# Patient Record
Sex: Male | Born: 1970 | Race: White | Hispanic: No | Marital: Married | Smoking: Never smoker
Health system: Southern US, Community
[De-identification: ages and names within clinical notes are randomized; demographics above are authoritative.]

## PROBLEM LIST (undated history)

## (undated) DIAGNOSIS — K529 Noninfective gastroenteritis and colitis, unspecified: Secondary | ICD-10-CM

## (undated) DIAGNOSIS — D649 Anemia, unspecified: Secondary | ICD-10-CM

## (undated) DIAGNOSIS — K469 Unspecified abdominal hernia without obstruction or gangrene: Secondary | ICD-10-CM

## (undated) DIAGNOSIS — L570 Actinic keratosis: Secondary | ICD-10-CM

## (undated) DIAGNOSIS — L409 Psoriasis, unspecified: Secondary | ICD-10-CM

## (undated) DIAGNOSIS — K5090 Crohn's disease, unspecified, without complications: Secondary | ICD-10-CM

## (undated) HISTORY — PX: HERNIA REPAIR: SHX51

## (undated) HISTORY — DX: Actinic keratosis: L57.0

## (undated) HISTORY — PX: COLONOSCOPY: SHX174

## (undated) HISTORY — PX: APPENDECTOMY: SHX54

---

## 2005-12-07 ENCOUNTER — Ambulatory Visit: Payer: Self-pay | Admitting: Gastroenterology

## 2006-02-03 ENCOUNTER — Ambulatory Visit: Payer: Self-pay | Admitting: Surgery

## 2007-02-09 ENCOUNTER — Ambulatory Visit: Payer: Self-pay | Admitting: Internal Medicine

## 2007-11-09 ENCOUNTER — Ambulatory Visit: Payer: Self-pay | Admitting: Gastroenterology

## 2008-09-19 ENCOUNTER — Emergency Department: Payer: Self-pay | Admitting: Internal Medicine

## 2012-09-25 DIAGNOSIS — C4492 Squamous cell carcinoma of skin, unspecified: Secondary | ICD-10-CM

## 2012-09-25 HISTORY — DX: Squamous cell carcinoma of skin, unspecified: C44.92

## 2013-11-30 ENCOUNTER — Ambulatory Visit: Payer: Self-pay | Admitting: Gastroenterology

## 2013-12-05 LAB — PATHOLOGY REPORT

## 2015-01-09 ENCOUNTER — Other Ambulatory Visit: Payer: Self-pay | Admitting: Internal Medicine

## 2015-01-09 DIAGNOSIS — M25562 Pain in left knee: Secondary | ICD-10-CM

## 2015-01-16 ENCOUNTER — Ambulatory Visit: Admission: RE | Admit: 2015-01-16 | Payer: BLUE CROSS/BLUE SHIELD | Source: Ambulatory Visit

## 2015-01-16 ENCOUNTER — Ambulatory Visit: Payer: BLUE CROSS/BLUE SHIELD

## 2015-01-27 ENCOUNTER — Ambulatory Visit
Admission: RE | Admit: 2015-01-27 | Discharge: 2015-01-27 | Disposition: A | Discharge status: Home / Self Care | Payer: BLUE CROSS/BLUE SHIELD | Source: Ambulatory Visit | Attending: Internal Medicine | Admitting: Internal Medicine

## 2015-01-27 DIAGNOSIS — M25562 Pain in left knee: Secondary | ICD-10-CM

## 2015-01-27 MED ORDER — IOHEXOL 240 MG/ML SOLN: 20.0000 mL | Freq: Once | INTRAMUSCULAR | Status: DC | PRN

## 2015-06-26 ENCOUNTER — Encounter: Payer: Self-pay | Admitting: *Deleted

## 2015-06-27 ENCOUNTER — Ambulatory Visit: Payer: Managed Care, Other (non HMO) | Admitting: Anesthesiology

## 2015-06-27 ENCOUNTER — Encounter
Admission: RE | Disposition: A | Discharge status: Home / Self Care | Payer: Self-pay | Source: Ambulatory Visit | Attending: Gastroenterology

## 2015-06-27 ENCOUNTER — Encounter: Payer: Self-pay | Admitting: *Deleted

## 2015-06-27 ENCOUNTER — Ambulatory Visit
Admission: RE | Admit: 2015-06-27 | Discharge: 2015-06-27 | Disposition: A | Discharge status: Home / Self Care | Payer: Managed Care, Other (non HMO) | Source: Ambulatory Visit | Attending: Gastroenterology | Admitting: Gastroenterology

## 2015-06-27 DIAGNOSIS — K529 Noninfective gastroenteritis and colitis, unspecified: Secondary | ICD-10-CM | POA: Insufficient documentation

## 2015-06-27 DIAGNOSIS — K5090 Crohn's disease, unspecified, without complications: Secondary | ICD-10-CM | POA: Diagnosis present

## 2015-06-27 DIAGNOSIS — K644 Residual hemorrhoidal skin tags: Secondary | ICD-10-CM | POA: Insufficient documentation

## 2015-06-27 DIAGNOSIS — Z79899 Other long term (current) drug therapy: Secondary | ICD-10-CM | POA: Insufficient documentation

## 2015-06-27 HISTORY — DX: Anemia, unspecified: D64.9

## 2015-06-27 HISTORY — DX: Psoriasis, unspecified: L40.9

## 2015-06-27 HISTORY — PX: COLONOSCOPY: SHX5424

## 2015-06-27 HISTORY — DX: Noninfective gastroenteritis and colitis, unspecified: K52.9

## 2015-06-27 HISTORY — DX: Unspecified abdominal hernia without obstruction or gangrene: K46.9

## 2015-06-27 HISTORY — DX: Crohn's disease, unspecified, without complications: K50.90

## 2015-06-27 SURGERY — COLONOSCOPY
Anesthesia: General

## 2015-06-27 MED ORDER — PROPOFOL 10 MG/ML IV BOLUS
INTRAVENOUS | Status: DC | PRN
  Administered 2015-06-27: 20 mg via INTRAVENOUS
  Administered 2015-06-27: 30 mg via INTRAVENOUS
  Administered 2015-06-27: 40 mg via INTRAVENOUS
  Administered 2015-06-27: 50 mg via INTRAVENOUS

## 2015-06-27 MED ORDER — PROPOFOL 500 MG/50ML IV EMUL
INTRAVENOUS | Status: DC | PRN
  Administered 2015-06-27: 100 ug/kg/min via INTRAVENOUS

## 2015-06-27 MED ORDER — FENTANYL CITRATE (PF) 100 MCG/2ML IJ SOLN
INTRAMUSCULAR | Status: DC | PRN
  Administered 2015-06-27: 50 ug via INTRAVENOUS

## 2015-06-27 MED ORDER — SODIUM CHLORIDE 0.9 % IV SOLN
INTRAVENOUS | Status: DC
  Administered 2015-06-27: 10:00:00 via INTRAVENOUS

## 2015-06-27 MED ORDER — SODIUM CHLORIDE 0.9 % IV SOLN: INTRAVENOUS | Status: DC

## 2015-06-27 NOTE — Anesthesia Preprocedure Evaluation (Signed)
Anesthesia Evaluation  Patient identified by MRN, date of birth, ID band Patient awake    Reviewed: Allergy & Precautions, H&P , NPO status , Patient's Chart, lab work & pertinent test results, reviewed documented beta blocker date and time   Airway Mallampati: II   Neck ROM: full    Dental  (+) Teeth Intact   Pulmonary neg pulmonary ROS,    Pulmonary exam normal        Cardiovascular negative cardio ROS Normal cardiovascular exam     Neuro/Psych negative neurological ROS  negative psych ROS   GI/Hepatic negative GI ROS, Neg liver ROS,   Endo/Other  negative endocrine ROS  Renal/GU negative Renal ROS  negative genitourinary   Musculoskeletal   Abdominal   Peds  Hematology negative hematology ROS (+) anemia ,   Anesthesia Other Findings Past Medical History:   Anemia                                                       Colitis                                                      Crohn's disease ([place])                                        Psoriasis                                                    Abdominal hernia                                           Past Surgical History:   HERNIA REPAIR                                                 APPENDECTOMY                                                  COLONOSCOPY                                                   Reproductive/Obstetrics                             Anesthesia Physical Anesthesia Plan  ASA: II  Anesthesia Plan: General   Post-op Pain Management:    Induction:  Airway Management Planned:   Additional Equipment:   Intra-op Plan:   Post-operative Plan:   Informed Consent: I have reviewed the patients History and Physical, chart, labs and discussed the procedure including the risks, benefits and alternatives for the proposed anesthesia with the patient or authorized representative who has indicated his/her  understanding and acceptance.   Dental Advisory Given  Plan Discussed with: CRNA  Anesthesia Plan Comments:         Anesthesia Quick Evaluation

## 2015-06-27 NOTE — H&P (Signed)
Outpatient short stay form Pre-procedure 06/27/2015 9:39 AM Calvin Sails MD  Primary Physician: Dr Tracie Harrier  Reason for visit:  Colonoscopy  History of present illness:  Patient is a 45 year old male presenting today for colonoscopy. He has a history of disease diagnosed in 2004. He is been on nasal call as well as Imuran for maintenance medication. He had a flare about 3 months ago which was well-controlled with a prednisone taper. He is presenting today for further evaluation prior to consideration of increasing Imuran dosing. He tolerated his prep well. He takes no blood thinning medications or aspirin products.    Current facility-administered medications:  .  0.9 %  sodium chloride infusion, , Intravenous, Continuous, Calvin Sails, MD, Last Rate: 20 mL/hr at 06/27/15 0936 .  0.9 %  sodium chloride infusion, , Intravenous, Continuous, Calvin Sails, MD  Prescriptions prior to admission  Medication Sig Dispense Refill Last Dose  . mesalamine (ASACOL) 400 MG EC tablet Take 400 mg by mouth 3 (three) times daily.     . Multiple Vitamins-Minerals (MULTIVITAMIN WITH MINERALS) tablet Take 1 tablet by mouth daily.   Past Week at Unknown time     No Known Allergies   Past Medical History  Diagnosis Date  . Anemia   . Colitis   . Crohn's disease ([place])   . Psoriasis   . Abdominal hernia     Review of systems:      Physical Exam    Heart and lungs: Regular rate and rhythm without rub or gallop, lungs are bilaterally clear.    HEENT: Normocephalic atraumatic eyes are anicteric    Other:     Pertinant exam for procedure: Soft nontender nondistended bowel sounds positive normoactive.    Planned proceedures: Colonoscopy and indicated procedures. I have discussed the risks benefits and complications of procedures to include not limited to bleeding, infection, perforation and the risk of sedation and the patient wishes to proceed.    Calvin Sails,  MD Gastroenterology 06/27/2015  9:39 AM

## 2015-06-27 NOTE — Anesthesia Postprocedure Evaluation (Signed)
Anesthesia Post Note  Patient: Calvin Peters  Procedure(s) Performed: Procedure(s) (LRB): COLONOSCOPY (N/A)  Patient location during evaluation: PACU Anesthesia Type: General Level of consciousness: awake and alert Pain management: pain level controlled Vital Signs Assessment: post-procedure vital signs reviewed and stable Respiratory status: spontaneous breathing, nonlabored ventilation, respiratory function stable and patient connected to nasal cannula oxygen Cardiovascular status: blood pressure returned to baseline and stable Postop Assessment: no signs of nausea or vomiting Anesthetic complications: no    Last Vitals:  Filed Vitals:   06/27/15 1040 06/27/15 1050  BP: 118/84 127/89  Pulse: 60 57  Temp:    Resp: 18 12    Last Pain: There were no vitals filed for this visit.               Molli Barrows

## 2015-06-27 NOTE — Op Note (Addendum)
[facility] Gastroenterology Patient Name: Calvin Peters Procedure Date: 06/27/2015 9:40 AM MRN: [id number] Account #: [id number] Date of Birth: 1970-11-24 Admit Type: Outpatient Age: 45 Room: [facility] ENDO ROOM 3 Gender: Male Note Status: Supervisor Override Procedure:         Colonoscopy Indications:       Personal history of Crohn's disease Providers:         Lollie Sails, MD Referring MD:      Tracie Harrier, MD (Referring MD) Medicines:         Monitored Anesthesia Care Complications:     No immediate complications. Procedure:         Pre-Anesthesia Assessment:                    - ASA Grade Assessment: II - A patient with mild systemic                     disease.                    After obtaining informed consent, the colonoscope was                     passed under direct vision. Throughout the procedure, the                     patient's blood pressure, pulse, and oxygen saturations                     were monitored continuously. The Olympus CF-H180AL                     colonoscope ( S#: [id number] ) was introduced through the                     anus and advanced to the the terminal ileum. The quality                     of the bowel preparation was good. The patient tolerated                     the procedure well. Findings:      Inflammation characterized by congestion (edema), erythema, friability       and granularity was found in a continuous and circumferential pattern       from the rectum to the sigmoid colon. This was mild in severity. There       are innumerable pseudopolyps beginning in the mid sigmoid and extending       proximally to the proximal ascending colon. Multiple of these were       biopsied. Biopsies for histology were taken with a cold forceps from the       cecum, ascending colon, transverse colon, descending colon, sigmoid       colon and rectum for evaluation of microscopic colitis.      The terminal ileum appeared  normal. Biopsies were taken with a cold       forceps for histology.      Non-bleeding external and internal hemorrhoids were found during       perianal exam and during anoscopy. The hemorrhoids were Grade I       (internal hemorrhoids that do not prolapse). Impression:        - Inflammation was found from the rectum to the sigmoid  colon secondary to Crohn's disease. Biopsied.                    - The examined portion of the ileum was normal. Biopsied. Recommendation:    - Discharge patient to home.                    - Discharge patient to home.                    - Continue present medications. Procedure Code(s): --- Professional ---                    [phone], Colonoscopy, flexible; with biopsy, single or                     multiple Diagnosis Code(s): --- Professional ---                    K50.90, Crohn's disease, unspecified, without complications                    Z87.19, Personal history of other diseases of the                     digestive system CPT copyright 2014 American [employer] Association. All rights reserved. The codes documented in this report are preliminary and upon coder review may  be revised to meet current compliance requirements. Lollie Sails, MD 06/27/2015 10:24:12 AM This report has been signed electronically. Number of Addenda: 0 Note Initiated On: 06/27/2015 9:40 AM Scope Withdrawal Time: 0 hours 19 minutes 11 seconds  Total Procedure Duration: 0 hours 28 minutes 24 seconds       [facility]

## 2015-06-27 NOTE — Transfer of Care (Signed)
Immediate Anesthesia Transfer of Care Note  Patient: Calvin Peters  Procedure(s) Performed: Procedure(s): COLONOSCOPY (N/A)  Patient Location: PACU  Anesthesia Type:General  Level of Consciousness: awake, alert , oriented and patient cooperative  Airway & Oxygen Therapy: Patient Spontanous Breathing and Patient connected to nasal cannula oxygen  Post-op Assessment: Report given to RN, Post -op Vital signs reviewed and stable and Patient moving all extremities  Post vital signs: Reviewed and stable  Last Vitals:  Filed Vitals:   06/27/15 0923 06/27/15 1021  BP: 122/83 107/69  Pulse: 66 69  Temp: 36.1 C 36 C  Resp: 18 19    Complications: No apparent anesthesia complications

## 2015-06-28 ENCOUNTER — Encounter: Payer: Self-pay | Admitting: Gastroenterology

## 2015-06-30 LAB — SURGICAL PATHOLOGY

## 2016-06-15 IMAGING — MR MR KNEE*L* W/O CM
6 series · 40 of 40 positions shown · IV contrast (agent unspecified)
Comparison: None.

CLINICAL DATA: Persistent knee pain with running.

EXAM:
MRI OF THE LEFT KNEE WITH CONTRAST(MR Arthrogram)
TECHNIQUE: Multiplanar, multisequence MR imaging of the knee was performed
following intra-articular contrast injection under fluoroscopic
guidance. No intravenous contrast was administered.

[Series 3: T1 fat-sat · axial · 3.0mm · 0.62mm/px · z∈[-57,+55]mm · 8 of 35 slices shown (1 of 3)]
[im 1/35]
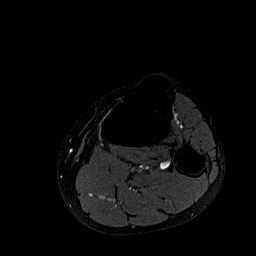
[im 5/35]
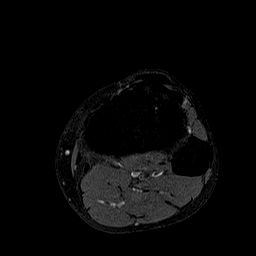
[im 10/35]
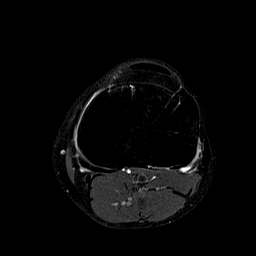
[im 15/35]
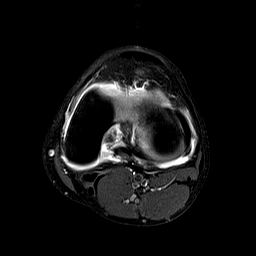
[im 20/35]
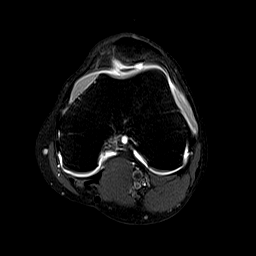
[im 25/35]
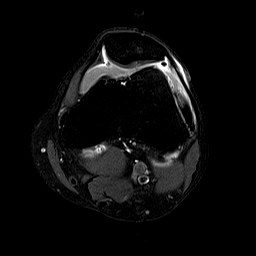
[im 30/35]
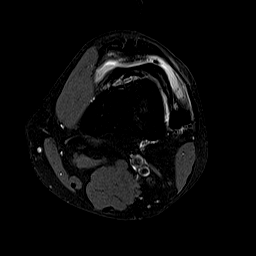
[im 35/35]
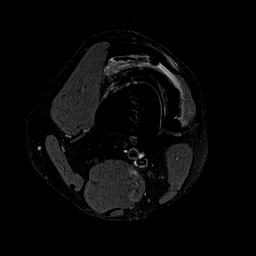

[Series 4: T1 fat-sat · coronal · 3.0mm · 0.50mm/px · 7 of 31 slices shown (2 of 3)]
[im 1/31]
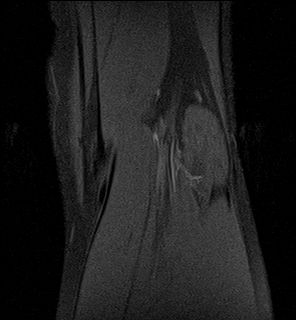
[im 6/31]
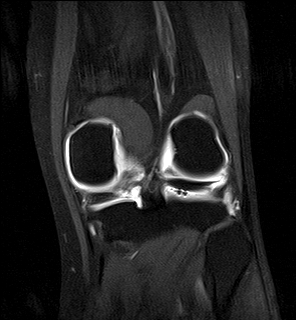
[im 11/31]
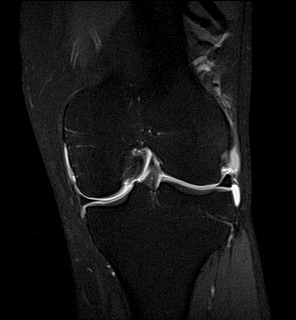
[im 16/31]
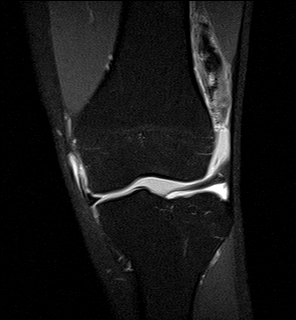
[im 21/31]
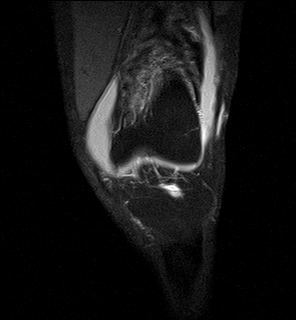
[im 26/31]
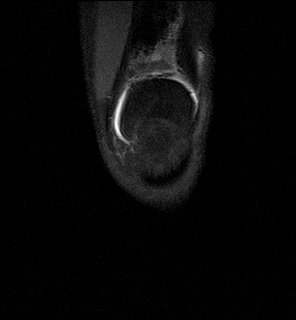
[im 31/31]
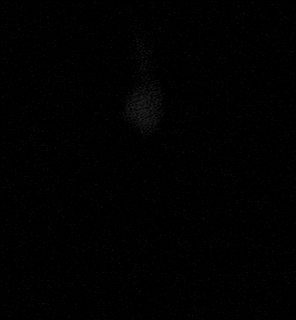

[Series 5: T1 · coronal · 3.0mm · 0.50mm/px · 6 of 31 slices shown]
[im 1/31]
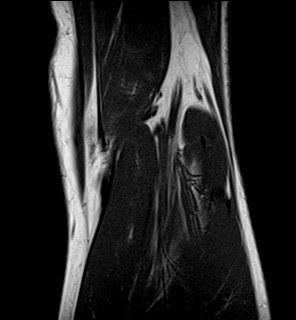
[im 7/31]
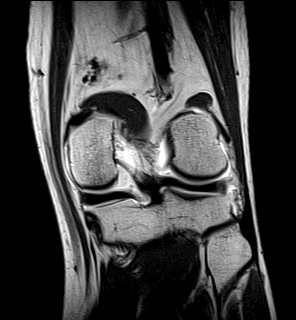
[im 13/31]
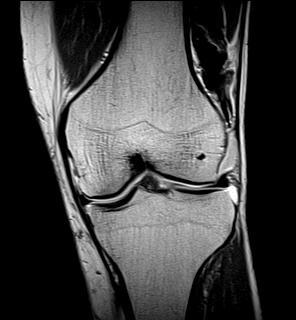
[im 19/31]
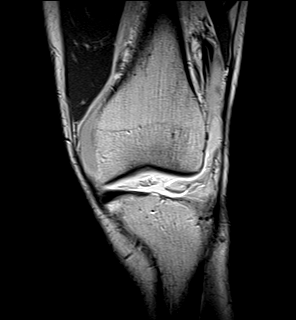
[im 25/31]
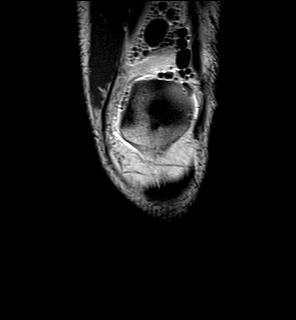
[im 31/31]
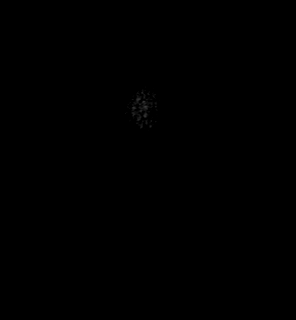

[Series 6: PD fat-sat · coronal · 3.0mm · 0.62mm/px · 6 of 31 slices shown]
[im 1/31]
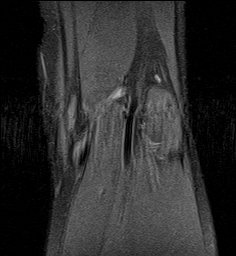
[im 7/31]
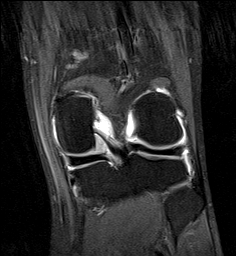
[im 13/31]
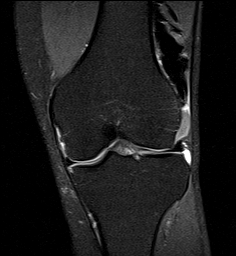
[im 19/31]
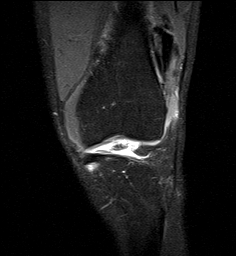
[im 25/31]
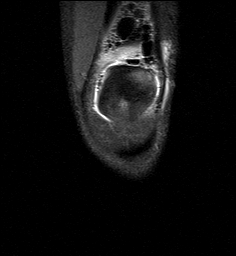
[im 31/31]
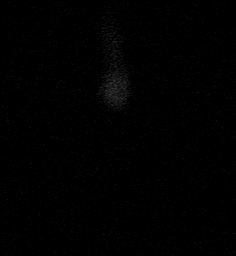

[Series 7: T2 fat-sat · coronal · 3.0mm · 0.31mm/px · 6 of 31 slices shown]
[im 1/31]
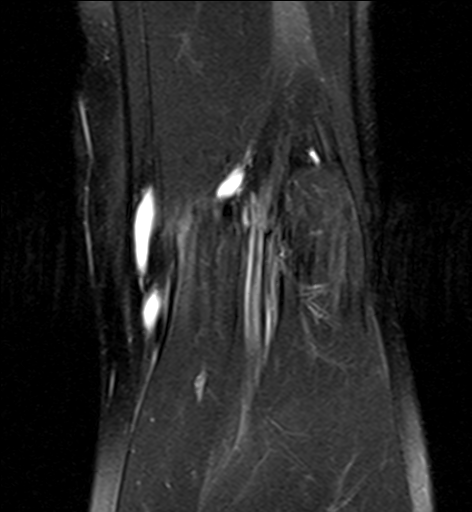
[im 7/31]
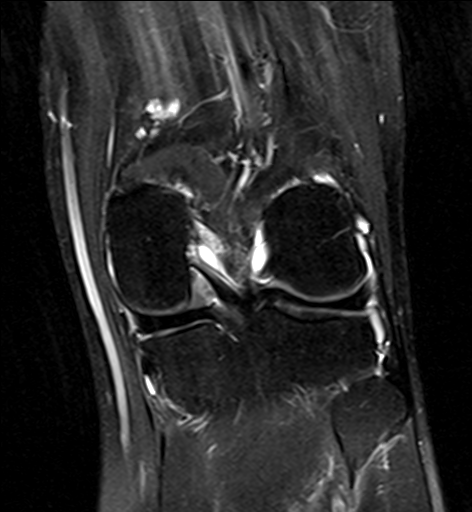
[im 13/31]
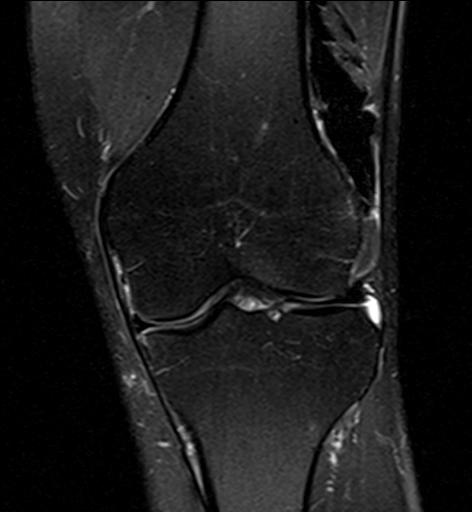
[im 19/31]
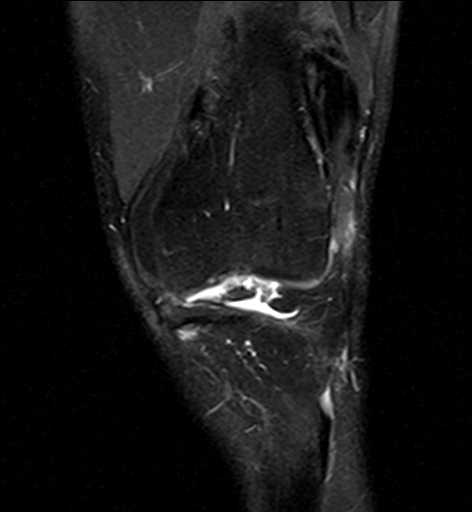
[im 25/31]
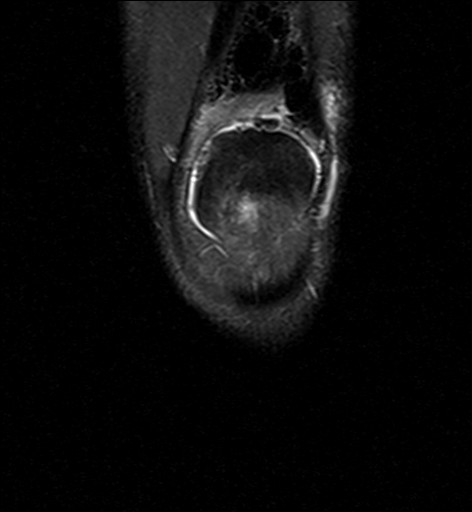
[im 31/31]
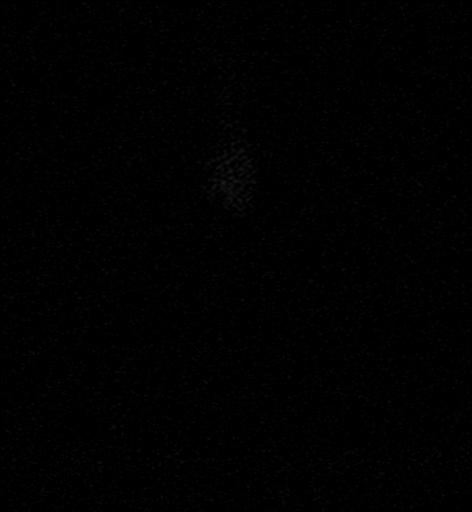

[Series 8: T1 fat-sat · sagittal · 3.0mm · 0.62mm/px · 7 of 32 slices shown (3 of 3)]
[im 1/32]
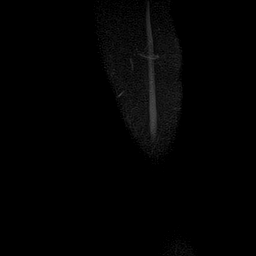
[im 6/32]
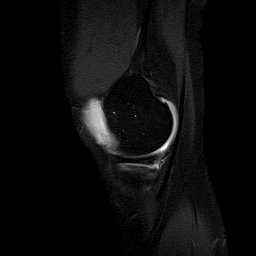
[im 11/32]
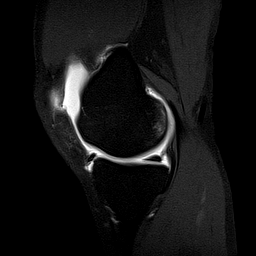
[im 16/32]
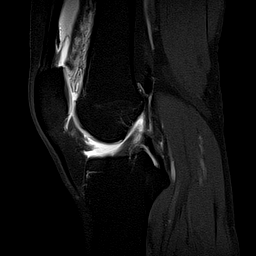
[im 21/32]
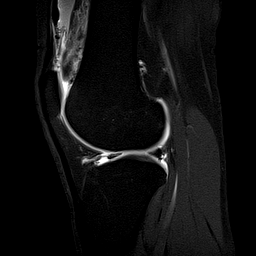
[im 26/32]
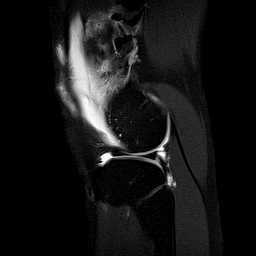
[im 32/32]
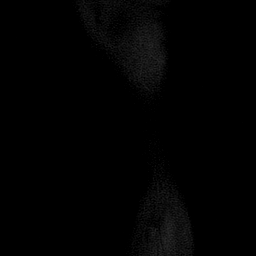

[40 of 40 positions shown; findings below may reference images not displayed]

FINDINGS: Moderate amount of contrast was injected into the deep suprapatellar
fat pad creating moderate low signal intensity artifact. There is a
moderate amount of air in the joint also. The knee was reinjected
and there is adequate intra-articular contrast.

The cruciate and collateral ligaments are intact. No meniscal tears
are identified. The articular cartilage is intact. No cartilage
defects or osteochondral lesions. Mild chondromalacia involving the
lateral patellar facet inferiorly.

The patella retinacular structures are intact and the quadriceps and
patellar tendons are intact.
IMPRESSION: 1. Intact ligamentous structures and no meniscal tears.
2. Mild chondromalacia involving the inferior aspect of the lateral
patellar facet.
3. No bone contusion, marrow edema or bone lesion.

## 2016-06-15 IMAGING — RF DG ARTHROGRAM KNEE*L*
2 series · 7 of 7 positions shown · IV contrast (multihance)
Comparison: none

CLINICAL DATA: pain since August 2014. Pt states he runs 2-3 times a
week, for several years. In [place] he participated in a marathon and
afterward his left knee pain was so severe that he has been unable
to run since. He did not recall a specific injury during the run,
but pain has not gotten better since [place].

EXAM:
LEFT IKNEE INJECTION FOR MRI
FLUOROSCOPY TIME:  1 minutes 85seconds
TECHNIQUE: The procedure, risks (including but not limited to bleeding,
infection, organ damage ), benefits, and alternatives were explained
to the patient. Questions regarding the procedure were encouraged
and answered. The patient understands and consents to the procedure.
Overlying skin had been prepped and draped in the usual sterile
fashion by Dr. Yoshikawa, and infiltrated locally with Lidocaine.
Fluoroscopic inspection demonstrated some contrast material superior
to the level of the patella. Overlying skin infiltrated with 1%
lidocaine. An 18 gauge needle advanced into the patellofemoral
compartment from a lateral approach. 1 ml of Lidocaine injected
easily. A mixture of 0.1 ml MultiHance in 10 ml of dilute Omnipaque
240 was then used to opacify the knee. No immediate complication.

[Series 3: cp_standard · 0.40mm/px · 4 of 50 frames shown (1 of 2)]
[frame 2/50]
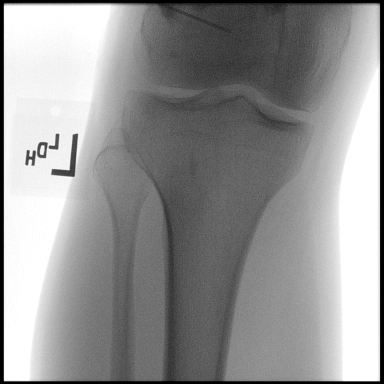
[frame 8/50]
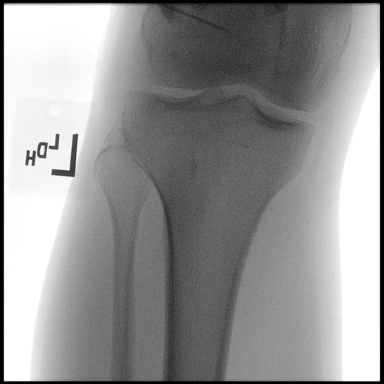
[frame 26/50]
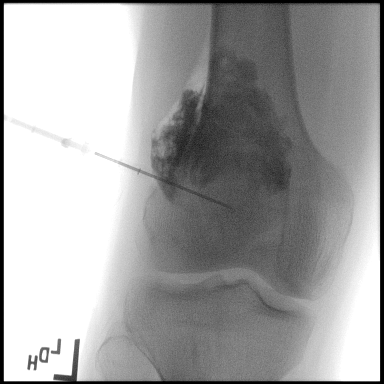
[frame 43/50]
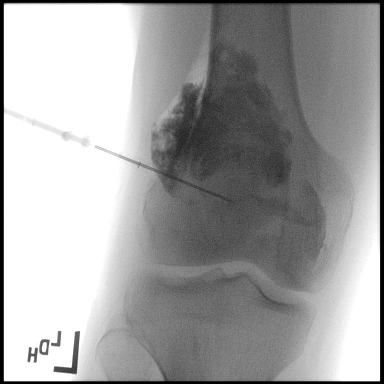

[Series 4: cp_standard · 0.40mm/px · 3 of 7 frames shown (2 of 2)]
[frame 2/7]
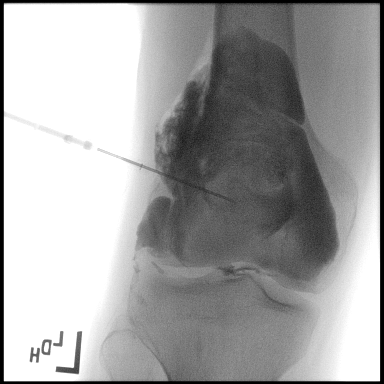
[frame 4/7]
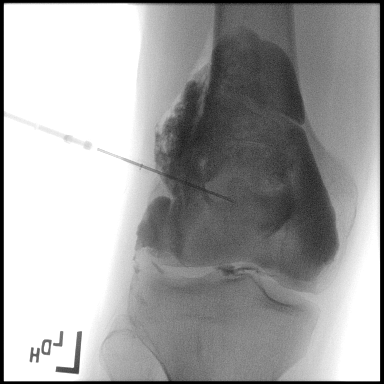
[frame 6/7]
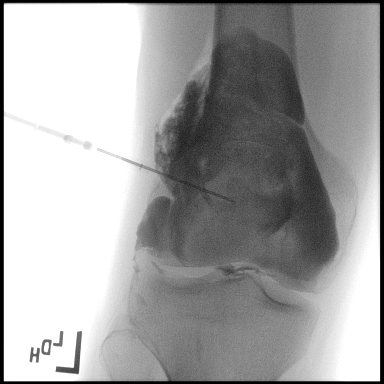

[7 of 7 positions shown; findings below may reference images not displayed]

IMPRESSION: Technically successful left knee injection for MRI.

## 2017-06-09 ENCOUNTER — Other Ambulatory Visit: Payer: Self-pay | Admitting: Internal Medicine

## 2017-06-09 DIAGNOSIS — M79604 Pain in right leg: Secondary | ICD-10-CM

## 2017-06-10 ENCOUNTER — Ambulatory Visit
Admission: RE | Admit: 2017-06-10 | Discharge: 2017-06-10 | Disposition: A | Discharge status: Home / Self Care | Payer: Managed Care, Other (non HMO) | Source: Ambulatory Visit | Attending: Internal Medicine | Admitting: Internal Medicine

## 2017-06-10 DIAGNOSIS — M898X6 Other specified disorders of bone, lower leg: Secondary | ICD-10-CM | POA: Diagnosis not present

## 2017-06-10 DIAGNOSIS — M79604 Pain in right leg: Secondary | ICD-10-CM

## 2017-08-03 DIAGNOSIS — E7800 Pure hypercholesterolemia, unspecified: Secondary | ICD-10-CM | POA: Insufficient documentation

## 2018-09-30 IMAGING — US US EXTREM LOW VENOUS*R*
1 series · 13 of 24 positions shown · non-contrast
Comparison: None.

CLINICAL DATA: Right lower extremity pain.



[Series 1: us extrem low venous*right* · 0.06mm/px · 13 of 33 slices shown]
[im 1/33]
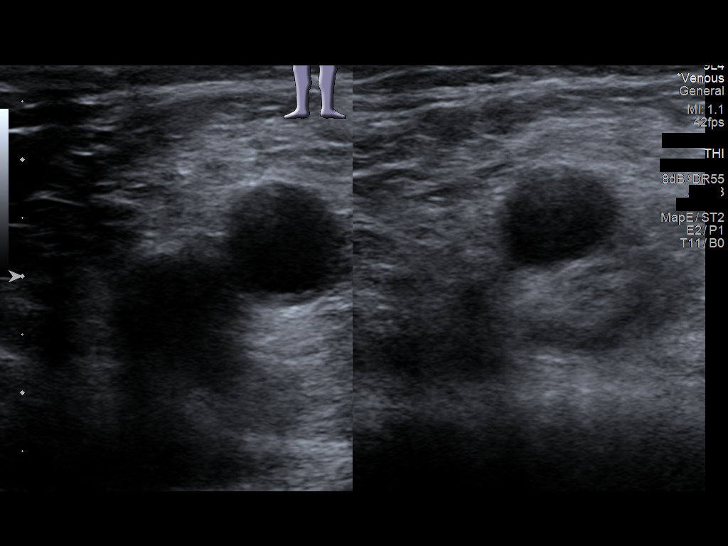
[im 3/33]
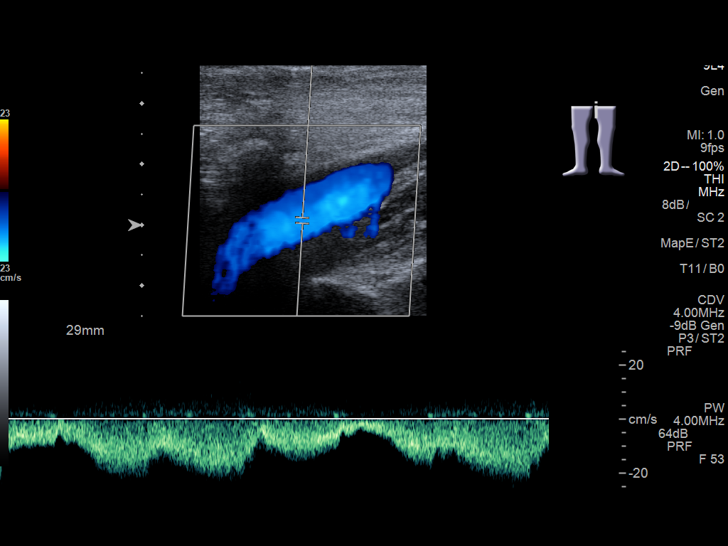
[im 6/33]
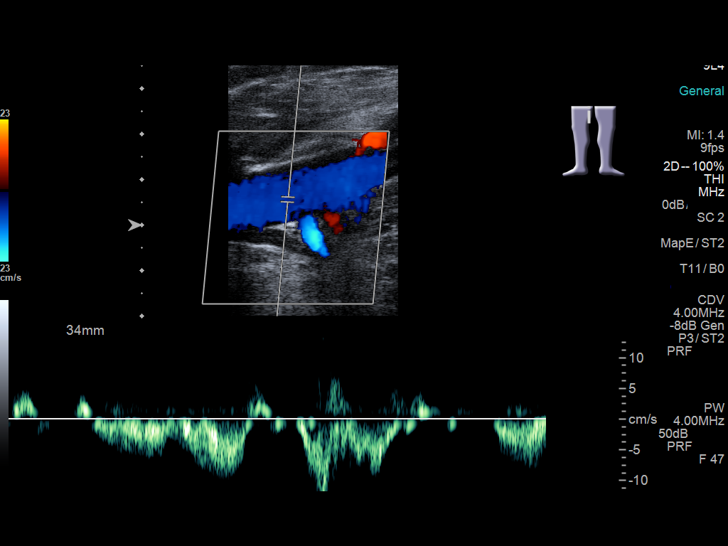
[im 9/33]
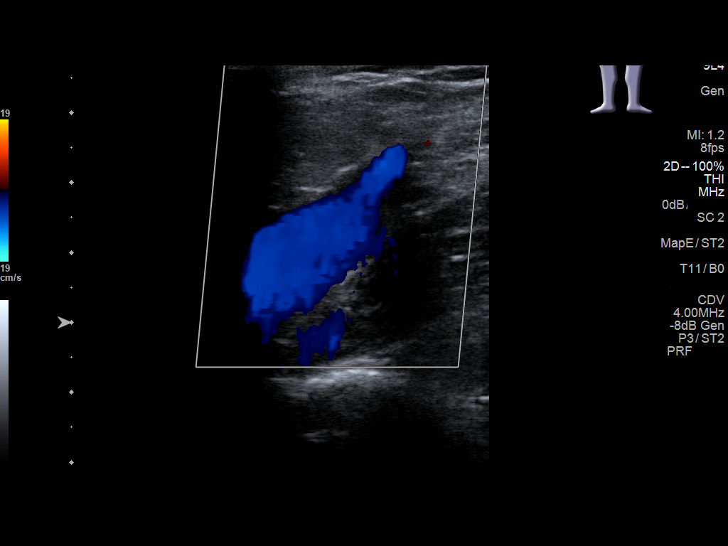
[im 12/33]
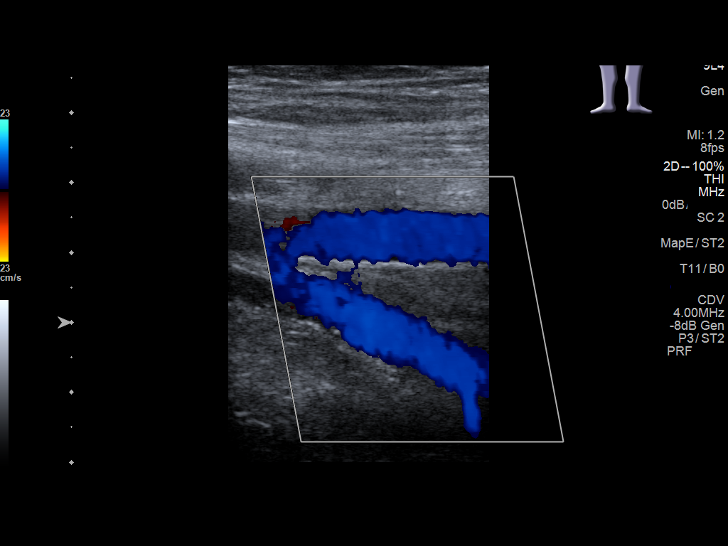
[im 14/33]
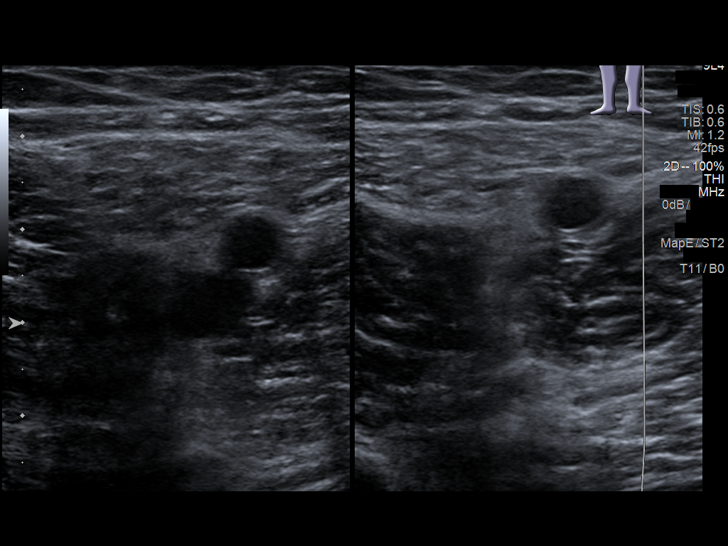
[im 17/33]
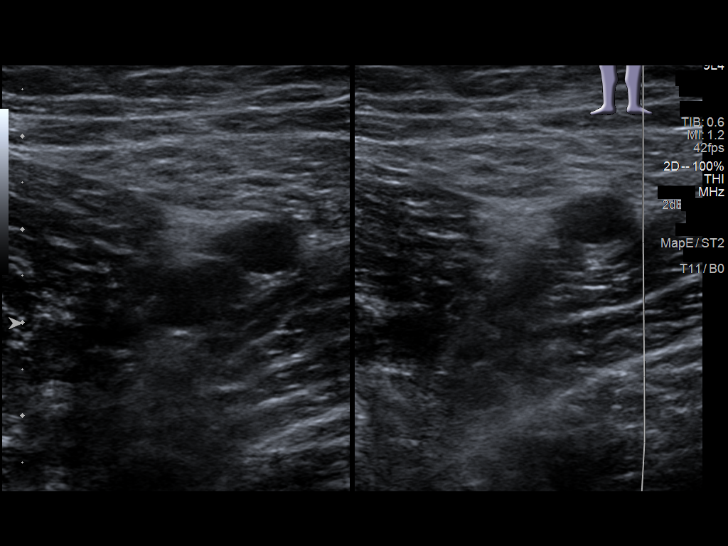
[im 19/33]
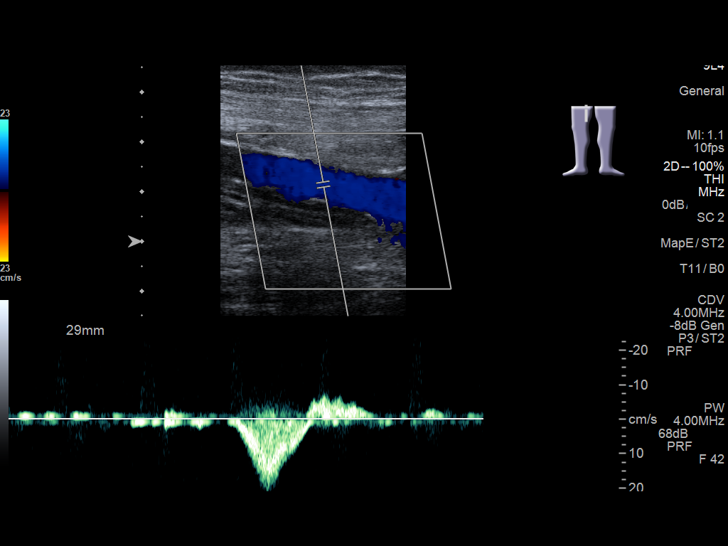
[im 21/33]
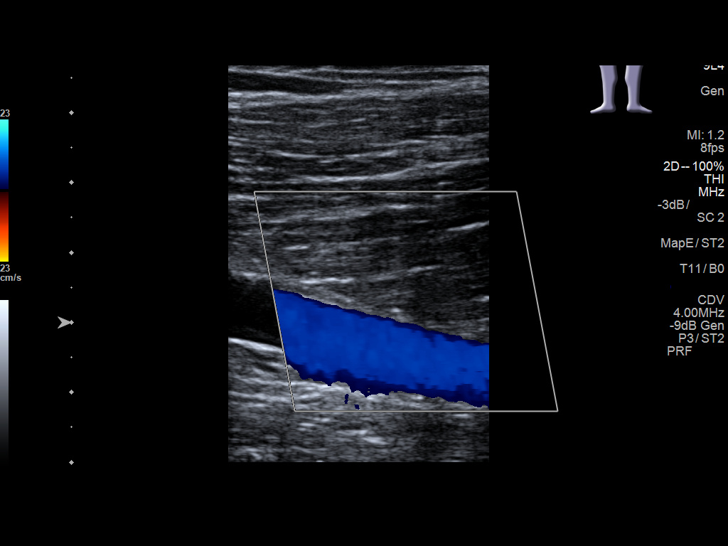
[im 24/33]
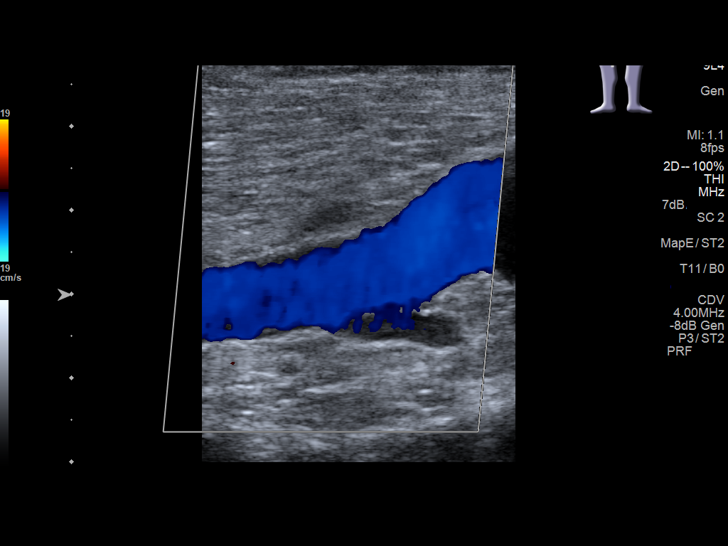
[im 27/33]
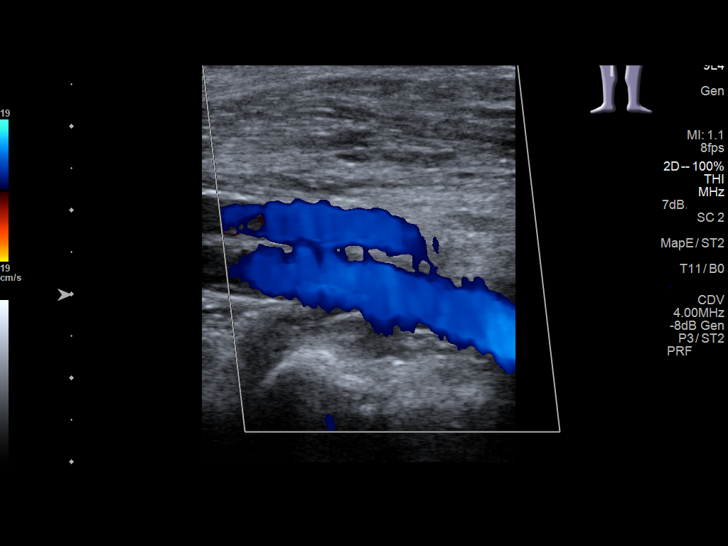
[im 30/33]
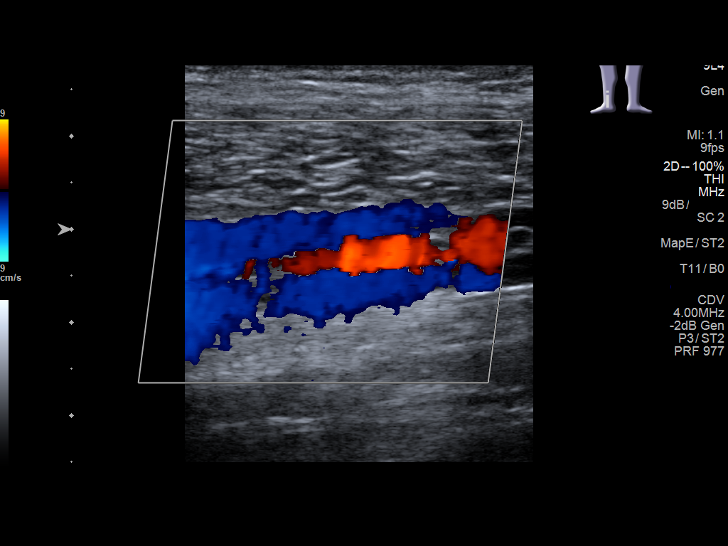
[im 33/33]
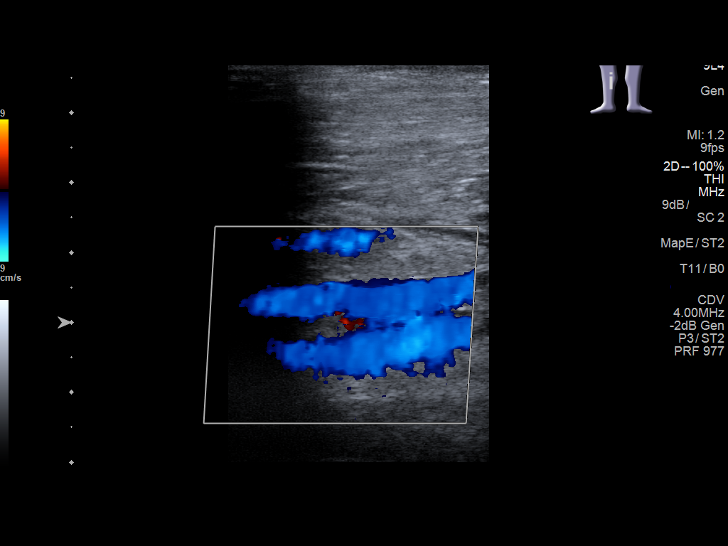

[13 of 24 positions shown; findings below may reference images not displayed]

FINDINGS: Contralateral Common Femoral Vein: Respiratory phasicity is normal
and symmetric with the symptomatic side. No evidence of thrombus.
Normal compressibility.

Common Femoral Vein: No evidence of thrombus. Normal
compressibility, respiratory phasicity and response to augmentation.

Saphenofemoral Junction: No evidence of thrombus. Normal
compressibility and flow on color Doppler imaging.

Profunda Femoral Vein: No evidence of thrombus. Normal
compressibility and flow on color Doppler imaging.

Femoral Vein: No evidence of thrombus. Normal compressibility,
respiratory phasicity and response to augmentation.

Popliteal Vein: No evidence of thrombus. Normal compressibility,
respiratory phasicity and response to augmentation.

Calf Veins: No evidence of thrombus. Normal compressibility and flow
on color Doppler imaging.

Superficial Great Saphenous Vein: No evidence of thrombus. Normal
compressibility.

Venous Reflux:  None.

Other Findings: No evidence of superficial thrombophlebitis or
abnormal fluid collection.
IMPRESSION: No evidence of right lower extremity deep venous thrombosis.

## 2019-09-05 ENCOUNTER — Ambulatory Visit: Payer: Managed Care, Other (non HMO) | Admitting: Dermatology

## 2019-09-05 ENCOUNTER — Other Ambulatory Visit: Payer: Self-pay

## 2019-09-05 DIAGNOSIS — L570 Actinic keratosis: Secondary | ICD-10-CM

## 2019-09-05 DIAGNOSIS — L820 Inflamed seborrheic keratosis: Secondary | ICD-10-CM | POA: Diagnosis not present

## 2019-09-05 DIAGNOSIS — L309 Dermatitis, unspecified: Secondary | ICD-10-CM

## 2019-09-05 MED ORDER — EUCRISA 2 % EX OINT: 1.0000 "application " | TOPICAL_OINTMENT | Freq: Two times a day (BID) | CUTANEOUS | 3 refills | Status: AC

## 2019-09-05 NOTE — Progress Notes (Signed)
   Follow-Up Visit   Subjective  Calvin Peters is a 49 y.o. male who presents for the following: Actinic Keratosis (follow up - face and scalp. LN2 x 17(05/2019) and treated with Fluorouracil/Calcipotriene creams.). He feels that he has improved with the cream treatment.   The following portions of the chart were reviewed this encounter and updated as appropriate: Tobacco  Allergies  Meds  Problems  Med Hx  Surg Hx  Fam Hx      Review of Systems: No other skin or systemic complaints.  Objective  Well appearing patient in no apparent distress; mood and affect are within normal limits.  A focused examination was performed including face, scalp, hands. Relevant physical exam findings are noted in the Assessment and Plan.  Objective  Face, scalp, hands (20): Erythematous thin papules/macules with gritty scale.   Objective  Hands: Pink patches.  Objective  Hands (17): Erythematous keratotic or waxy stuck-on papule or plaque.   Assessment & Plan  AK (actinic keratosis) (20) Face, scalp, hands  Plan to restart Fluorouracil 5%/Calcipotriene cream BID x 1 week to face, scalp and hands in 1 month - pt has enough medication at home.  Destruction of lesion - Face, scalp, hands Complexity: simple   Destruction method: cryotherapy   Informed consent: discussed and consent obtained   Timeout:  patient name, date of birth, surgical site, and procedure verified Lesion destroyed using liquid nitrogen: Yes   Region frozen until ice ball extended beyond lesion: Yes   Outcome: patient tolerated procedure well with no complications   Post-procedure details: wound care instructions given    Eczema, unspecified type Hands  Crisaborole (EUCRISA) 2 % OINT - Hands  Inflamed seborrheic keratosis (17) Hands  Destruction of lesion - Hands Complexity: simple   Destruction method: cryotherapy   Informed consent: discussed and consent obtained   Timeout:  patient name, date of birth,  surgical site, and procedure verified Lesion destroyed using liquid nitrogen: Yes   Region frozen until ice ball extended beyond lesion: Yes   Outcome: patient tolerated procedure well with no complications   Post-procedure details: wound care instructions given    Return 4 months for follow up  I, Ashok Cordia, CMA, am acting as scribe for Sarina Ser, MD .  Documentation: I have reviewed the above documentation for accuracy and completeness, and I agree with the above.  Sarina Ser, MD

## 2019-09-05 NOTE — Patient Instructions (Signed)

## 2019-09-06 ENCOUNTER — Encounter: Payer: Self-pay | Admitting: Dermatology

## 2019-12-27 ENCOUNTER — Encounter: Payer: Self-pay | Admitting: Dermatology

## 2019-12-27 ENCOUNTER — Ambulatory Visit: Payer: Managed Care, Other (non HMO) | Admitting: Dermatology

## 2019-12-27 ENCOUNTER — Other Ambulatory Visit: Payer: Self-pay

## 2019-12-27 DIAGNOSIS — B079 Viral wart, unspecified: Secondary | ICD-10-CM

## 2019-12-27 DIAGNOSIS — L720 Epidermal cyst: Secondary | ICD-10-CM | POA: Diagnosis not present

## 2019-12-27 DIAGNOSIS — L570 Actinic keratosis: Secondary | ICD-10-CM | POA: Diagnosis not present

## 2019-12-27 DIAGNOSIS — L309 Dermatitis, unspecified: Secondary | ICD-10-CM | POA: Diagnosis not present

## 2019-12-27 DIAGNOSIS — L578 Other skin changes due to chronic exposure to nonionizing radiation: Secondary | ICD-10-CM

## 2019-12-27 NOTE — Progress Notes (Signed)
   Follow-Up Visit   Subjective  Calvin Peters is a 49 y.o. male who presents for the following: Actinic Keratosis (50m f/u, face, scalp, hands LN2 last visits, in past topical field txt and PDT), ISKs (48m f/u, hands), and hx of eczema (neck, Eucrisa oint in past).  The following portions of the chart were reviewed this encounter and updated as appropriate:  Tobacco  Allergies  Meds  Problems  Med Hx  Surg Hx  Fam Hx     Review of Systems:  No other skin or systemic complaints except as noted in HPI or Assessment and Plan.  Objective  Well appearing patient in no apparent distress; mood and affect are within normal limits.  A focused examination was performed including face, scalp, hands/arms. Relevant physical exam findings are noted in the Assessment and Plan.  Objective  hands, scalp, temples nasal bridge and clavicles: Pink scaly macules   Objective  Neck - Anterior: Neck clear today  Objective  R infraocular: Smooth white papule(s).   Objective  hands: Verrucous papules -- Discussed viral etiology and contagion.    Assessment & Plan    Actinic Damage - diffuse scaly erythematous macules with underlying dyspigmentation - Recommend daily broad spectrum sunscreen SPF 30+ to sun-exposed areas, reapply every 2 hours as needed.  - Call for new or changing lesions.  AK (actinic keratosis) hands, scalp, temples nasal bridge and clavicles Restart 5FU/Calcipotriene cr bid for 7 days to dorsum hands, top of scalp, nasal bridge, temples and clavicle  Eczema, unspecified type Neck - Anterior Clear today Restart Eucrisa ointment prn flares  Other Related Medications Crisaborole (EUCRISA) 2 % OINT  Milia R infraocular Benign, observe.   Viral warts, unspecified type hands Benign, pt declines treatment today  Return in about 6 months (around 06/28/2020) for AK f/u.   I, Othelia Pulling, RMA, am acting as scribe for Sarina Ser, MD .  Documentation: I have  reviewed the above documentation for accuracy and completeness, and I agree with the above.  Sarina Ser, MD

## 2020-01-02 ENCOUNTER — Encounter: Payer: Self-pay | Admitting: Dermatology

## 2020-07-07 ENCOUNTER — Ambulatory Visit: Payer: Managed Care, Other (non HMO) | Admitting: Dermatology

## 2020-07-07 ENCOUNTER — Other Ambulatory Visit: Payer: Self-pay

## 2020-07-07 DIAGNOSIS — L578 Other skin changes due to chronic exposure to nonionizing radiation: Secondary | ICD-10-CM

## 2020-07-07 DIAGNOSIS — L570 Actinic keratosis: Secondary | ICD-10-CM | POA: Diagnosis not present

## 2020-07-07 DIAGNOSIS — D492 Neoplasm of unspecified behavior of bone, soft tissue, and skin: Secondary | ICD-10-CM

## 2020-07-07 DIAGNOSIS — C4442 Squamous cell carcinoma of skin of scalp and neck: Secondary | ICD-10-CM | POA: Diagnosis not present

## 2020-07-07 DIAGNOSIS — L821 Other seborrheic keratosis: Secondary | ICD-10-CM

## 2020-07-07 NOTE — Patient Instructions (Signed)

## 2020-07-07 NOTE — Progress Notes (Signed)
Follow-Up Visit   Subjective  Calvin Peters is a 50 y.o. male who presents for the following: Follow-up (6 months f/u Aks on the hands, scalp, face treated with 5FU/Calcipotriene cream with a good response ).  The following portions of the chart were reviewed this encounter and updated as appropriate:   Tobacco  Allergies  Meds  Problems  Med Hx  Surg Hx  Fam Hx     Review of Systems:  No other skin or systemic complaints except as noted in HPI or Assessment and Plan.  Objective  Well appearing patient in no apparent distress; mood and affect are within normal limits.  A focused examination was performed including face, scalp, hands . Relevant physical exam findings are noted in the Assessment and Plan.  Objective  ant vertex scalp: 1.1 x 0.8 cm pink plaque   Objective  face, hands (22): Erythematous thin papules/macules with gritty scale.    Assessment & Plan  Neoplasm of skin ant vertex scalp  Skin / nail biopsy Type of biopsy: tangential   Informed consent: discussed and consent obtained   Patient was prepped and draped in usual sterile fashion: area prepped with alochol. Anesthesia: the lesion was anesthetized in a standard fashion   Anesthetic:  1% lidocaine w/ epinephrine 1-100,000 buffered w/ 8.4% NaHCO3 Instrument used: flexible razor blade   Hemostasis achieved with: pressure, aluminum chloride and electrodesiccation   Outcome: patient tolerated procedure well   Post-procedure details: wound care instructions given   Post-procedure details comment:  Ointment and small bandage  Specimen 1 - Surgical pathology Differential Diagnosis: R/O BCC vs other   Check Margins: No 1.1 x 0.8 cm pink plaque  AK (actinic keratosis) (22) face, hands  Destruction of lesion - face, hands Complexity: simple   Destruction method: cryotherapy   Informed consent: discussed and consent obtained   Timeout:  patient name, date of birth, surgical site, and procedure  verified Lesion destroyed using liquid nitrogen: Yes   Region frozen until ice ball extended beyond lesion: Yes   Outcome: patient tolerated procedure well with no complications   Post-procedure details: wound care instructions given    Actinic Damage - chronic, secondary to cumulative UV radiation exposure/sun exposure over time - diffuse scaly erythematous macules with underlying dyspigmentation - Recommend daily broad spectrum sunscreen SPF 30+ to sun-exposed areas, reapply every 2 hours as needed.  - Call for new or changing lesions.  Seborrheic Keratoses - Stuck-on, waxy, tan-brown papules and plaques  - Discussed benign etiology and prognosis. - Observe - Call for any changes  Actinic Damage Hands and face restart 5FU/Calcipotriene cream in 1 month - Severe, chronic, secondary to cumulative UV radiation exposure over time - diffuse scaly erythematous macules and papules with underlying dyspigmentation - Discussed Prescription "Field Treatment" for Severe, Chronic Confluent Actinic Changes with Pre-Cancerous Actinic Keratoses Field treatment involves treatment of an entire area of skin that has confluent Actinic Changes (Sun/ Ultraviolet light damage) and PreCancerous Actinic Keratoses by method of PhotoDynamic Therapy (PDT) and/or prescription Topical Chemotherapy agents such as 5-fluorouracil, 5-fluorouracil/calcipotriene, and/or imiquimod.  The purpose is to decrease the number of clinically evident and subclinical PreCancerous lesions to prevent progression to development of skin cancer by chemically destroying early precancer changes that may or may not be visible.  It has been shown to reduce the risk of developing skin cancer in the treated area. As a result of treatment, redness, scaling, crusting, and open sores may occur during treatment course. One or more  than one of these methods may be used and may have to be used several times to control, suppress and eliminate the  PreCancerous changes. Discussed treatment course, expected reaction, and possible side effects. - Recommend daily broad spectrum sunscreen SPF 30+ to sun-exposed areas, reapply every 2 hours as needed.  - Call for new or changing lesions.  Return in about 6 months (around 01/04/2021) for Aks .  IMarye Round, CMA, am acting as scribe for Sarina Ser, MD .  Documentation: I have reviewed the above documentation for accuracy and completeness, and I agree with the above.  Sarina Ser, MD

## 2020-07-08 ENCOUNTER — Encounter: Payer: Self-pay | Admitting: Dermatology

## 2020-07-15 ENCOUNTER — Telehealth: Payer: Self-pay

## 2020-07-15 NOTE — Telephone Encounter (Signed)
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

## 2020-07-15 NOTE — Telephone Encounter (Signed)
Left message for patient to call office for results/hd 

## 2020-07-16 ENCOUNTER — Telehealth: Payer: Self-pay

## 2020-07-16 NOTE — Telephone Encounter (Signed)
Patient advised of biopsy results and scheduled for EDC. 

## 2020-07-16 NOTE — Telephone Encounter (Signed)
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

## 2020-08-14 ENCOUNTER — Other Ambulatory Visit: Payer: Self-pay

## 2020-08-14 ENCOUNTER — Ambulatory Visit: Payer: Managed Care, Other (non HMO) | Admitting: Dermatology

## 2020-08-14 ENCOUNTER — Encounter: Payer: Self-pay | Admitting: Dermatology

## 2020-08-14 DIAGNOSIS — C4442 Squamous cell carcinoma of skin of scalp and neck: Secondary | ICD-10-CM | POA: Diagnosis not present

## 2020-08-14 DIAGNOSIS — C4492 Squamous cell carcinoma of skin, unspecified: Secondary | ICD-10-CM

## 2020-08-14 DIAGNOSIS — L570 Actinic keratosis: Secondary | ICD-10-CM

## 2020-08-14 DIAGNOSIS — L578 Other skin changes due to chronic exposure to nonionizing radiation: Secondary | ICD-10-CM | POA: Diagnosis not present

## 2020-08-14 NOTE — Patient Instructions (Signed)
If you have any questions or concerns for your doctor, please call our main line at [phone] and press option 4 to reach your doctor's medical assistant. If no one answers, please leave a voicemail as directed and we will return your call as soon as possible. Messages left after 4 pm will be answered the following business day.   You may also send us a message via MyChart. We typically respond to MyChart messages within 1-2 business days.  For prescription refills, please ask your pharmacy to contact our office. Our fax number is [phone].  If you have an urgent issue when the clinic is closed that cannot wait until the next business day, you can page your doctor at the number below.    Please note that while we do our best to be available for urgent issues outside of office hours, we are not available 24/7.   If you have an urgent issue and are unable to reach us, you may choose to seek medical care at your doctor's office, retail clinic, urgent care center, or emergency room.  If you have a medical emergency, please immediately call 911 or go to the emergency department.  Pager Numbers  - Dr. Kowalski: [phone]  - Dr. Moye: [phone]  - Dr. Stewart: [phone]  In the event of inclement weather, please call our main line at [phone] for an update on the status of any delays or closures.  Dermatology Medication Tips: Please keep the boxes that topical medications come in in order to help keep track of the instructions about where and how to use these. Pharmacies typically print the medication instructions only on the boxes and not directly on the medication tubes.   If your medication is too expensive, please contact our office at [phone] option 4 or send us a message through MyChart.   We are unable to tell what your co-pay for medications will be in advance as this is different depending on your insurance coverage. However, we may be able to find a  substitute medication at lower cost or fill out paperwork to get insurance to cover a needed medication.   If a prior authorization is required to get your medication covered by your insurance company, please allow us 1-2 business days to complete this process.  Drug prices often vary depending on where the prescription is filled and some pharmacies may offer cheaper prices.  The website www.goodrx.com contains coupons for medications through different pharmacies. The prices here do not account for what the cost may be with help from insurance (it may be cheaper with your insurance), but the website can give you the price if you did not use any insurance.  - You can print the associated coupon and take it with your prescription to the pharmacy.  - You may also stop by our office during regular business hours and pick up a GoodRx coupon card.  - If you need your prescription sent electronically to a different pharmacy, notify our office through [facility] MyChart or by phone at [phone] option 4.     Wound Care Instructions  1. Cleanse wound gently with soap and water once a day then pat dry with clean gauze. Apply a thing coat of Petrolatum (petroleum jelly, "Vaseline") over the wound (unless you have an allergy to this). We recommend that you use a new, sterile tube of Vaseline. Do not pick or remove scabs. Do not remove the yellow or white "healing tissue" from the base of the wound.    2. Cover the wound with fresh, clean, nonstick gauze and secure with paper tape. You may use Band-Aids in place of gauze and tape if the would is small enough, but would recommend trimming much of the tape off as there is often too much. Sometimes Band-Aids can irritate the skin.  3. You should call the office for your biopsy report after 1 week if you have not already been contacted.  4. If you experience any problems, such as abnormal amounts of bleeding, swelling, significant bruising, significant pain,  or evidence of infection, please call the office immediately.  5. FOR ADULT SURGERY PATIENTS: If you need something for pain relief you may take 1 extra strength Tylenol (acetaminophen) AND 2 Ibuprofen (200mg each) together every 4 hours as needed for pain. (do not take these if you are allergic to them or if you have a reason you should not take them.) Typically, you may only need pain medication for 1 to 3 days.     

## 2020-08-14 NOTE — Progress Notes (Signed)
Follow-Up Visit   Subjective  Calvin Peters is a 50 y.o. male who presents for the following: SCC bx proven (Ant vertex scalp, pt presents for [facility]). He has other areas to be evaluated today.  The following portions of the chart were reviewed this encounter and updated as appropriate:   Tobacco  Allergies  Meds  Problems  Med Hx  Surg Hx  Fam Hx     Review of Systems:  No other skin or systemic complaints except as noted in HPI or Assessment and Plan.  Objective  Well appearing patient in no apparent distress; mood and affect are within normal limits.  A focused examination was performed including scalp. Relevant physical exam findings are noted in the Assessment and Plan.  Objective  Ant vertex scalp: Pink bx site  Objective  Left Lower Vermilion Lip x 1, L forehead x 3 (4): Pink scaly macules    Assessment & Plan    Actinic Damage - Severe, confluent actinic changes with pre-cancerous actinic keratoses  - Severe, chronic, not at goal, secondary to cumulative UV radiation exposure over time - diffuse scaly erythematous macules and papules with underlying dyspigmentation - Discussed Prescription "Field Treatment" for Severe, Chronic Confluent Actinic Changes with Pre-Cancerous Actinic Keratoses Field treatment involves treatment of an entire area of skin that has confluent Actinic Changes (Sun/ Ultraviolet light damage) and PreCancerous Actinic Keratoses by method of PhotoDynamic Therapy (PDT) and/or prescription Topical Chemotherapy agents such as 5-fluorouracil, 5-fluorouracil/calcipotriene, and/or imiquimod.  The purpose is to decrease the number of clinically evident and subclinical PreCancerous lesions to prevent progression to development of skin cancer by chemically destroying early precancer changes that may or may not be visible.  It has been shown to reduce the risk of developing skin cancer in the treated area. As a result of treatment, redness, scaling,  crusting, and open sores may occur during treatment course. One or more than one of these methods may be used and may have to be used several times to control, suppress and eliminate the PreCancerous changes. Discussed treatment course, expected reaction, and possible side effects. - Recommend daily broad spectrum sunscreen SPF 30+ to sun-exposed areas, reapply every 2 hours as needed.  - Staying in the shade or wearing long sleeves, sun glasses (UVA+UVB protection) and wide brim hats (4-inch brim around the entire circumference of the hat) are also recommended. - Call for new or changing lesions. -Pt to start [facility] 5FU/Calcipotriene to bil dorsum hands and spot txt to aa face bid x 7 days.  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  Destruction of lesion Complexity: extensive   Destruction method: electrodesiccation and curettage   Informed consent: discussed and consent obtained   Timeout:  patient name, date of birth, surgical site, and procedure verified Procedure prep:  Patient was prepped and draped in usual sterile fashion Prep type:  Isopropyl alcohol Anesthesia: the lesion was anesthetized in a standard fashion   Anesthetic:  1% lidocaine w/ epinephrine 1-100,000 buffered w/ 8.4% NaHCO3 Curettage performed in three different directions: Yes   Electrodesiccation performed over the curetted area: Yes   Final wound size (cm):  1.6 Hemostasis achieved with:  pressure, aluminum chloride and electrodesiccation Outcome: patient tolerated procedure well with no complications   Post-procedure details: sterile dressing applied and wound care instructions given   Dressing type: bandage and petrolatum    Bx proven  EDC today, discussed small risk of recurrence.  AK (actinic keratosis) (4) Left Lower Vermilion Lip  x 1, L forehead x 3  Destruction of lesion - Left Lower Vermilion Lip x 1, L forehead x 3 Complexity: simple   Destruction method: cryotherapy   Informed  consent: discussed and consent obtained   Timeout:  patient name, date of birth, surgical site, and procedure verified Lesion destroyed using liquid nitrogen: Yes   Region frozen until ice ball extended beyond lesion: Yes   Outcome: patient tolerated procedure well with no complications   Post-procedure details: wound care instructions given    Return for as scheduled 01/05/21 for 14m AK f/u.   I, Othelia Pulling, RMA, am acting as scribe for Sarina Ser, MD .  Documentation: I have reviewed the above documentation for accuracy and completeness, and I agree with the above.  Sarina Ser, MD

## 2021-01-05 ENCOUNTER — Ambulatory Visit: Payer: Managed Care, Other (non HMO) | Admitting: Dermatology

## 2021-01-05 ENCOUNTER — Other Ambulatory Visit: Payer: Self-pay

## 2021-01-05 DIAGNOSIS — L578 Other skin changes due to chronic exposure to nonionizing radiation: Secondary | ICD-10-CM

## 2021-01-05 DIAGNOSIS — L570 Actinic keratosis: Secondary | ICD-10-CM | POA: Diagnosis not present

## 2021-01-05 DIAGNOSIS — D485 Neoplasm of uncertain behavior of skin: Secondary | ICD-10-CM

## 2021-01-05 DIAGNOSIS — C44622 Squamous cell carcinoma of skin of right upper limb, including shoulder: Secondary | ICD-10-CM

## 2021-01-05 DIAGNOSIS — L820 Inflamed seborrheic keratosis: Secondary | ICD-10-CM

## 2021-01-05 NOTE — Progress Notes (Signed)
Follow-Up Visit   Subjective  Calvin Peters is a 50 y.o. male who presents for the following: Actinic Keratosis (6 month follow up of face, and hands treated with LN2 x 22).  The following portions of the chart were reviewed this encounter and updated as appropriate:   Tobacco  Allergies  Meds  Problems  Med Hx  Surg Hx  Fam Hx     Review of Systems:  No other skin or systemic complaints except as noted in HPI or Assessment and Plan.  Objective  Well appearing patient in no apparent distress; mood and affect are within normal limits.  A focused examination was performed including face, scalp, arms, hands. Relevant physical exam findings are noted in the Assessment and Plan.  Right forearm Hyperkeratotic papule  Scalp, ears, hands (18) Erythematous thin papules/macules with gritty scale.   Right distal dorsum forearm x 1, left distal dorsum forearm x 1 (2) Erythematous keratotic or waxy stuck-on papule or plaque.    Assessment & Plan  Neoplasm of uncertain behavior of skin Right forearm  Epidermal / dermal shaving  Informed consent: discussed and consent obtained   Timeout: patient name, date of birth, surgical site, and procedure verified   Procedure prep:  Patient was prepped and draped in usual sterile fashion Prep type:  Isopropyl alcohol Anesthesia: the lesion was anesthetized in a standard fashion   Anesthetic:  1% lidocaine w/ epinephrine 1-100,000 buffered w/ 8.4% NaHCO3 Instrument used: flexible razor blade   Hemostasis achieved with: pressure, aluminum chloride and electrodesiccation   Outcome: patient tolerated procedure well   Post-procedure details: sterile dressing applied and wound care instructions given   Dressing type: bandage and petrolatum    Destruction of lesion Complexity: extensive   Destruction method: electrodesiccation and curettage   Informed consent: discussed and consent obtained   Timeout:  patient name, date of birth, surgical  site, and procedure verified Procedure prep:  Patient was prepped and draped in usual sterile fashion Prep type:  Isopropyl alcohol Anesthesia: the lesion was anesthetized in a standard fashion   Anesthetic:  1% lidocaine w/ epinephrine 1-100,000 buffered w/ 8.4% NaHCO3 Curettage performed in three different directions: Yes   Electrodesiccation performed over the curetted area: Yes   Final wound size (cm):  1.2 Hemostasis achieved with:  pressure and aluminum chloride Outcome: patient tolerated procedure well with no complications   Post-procedure details: sterile dressing applied and wound care instructions given   Dressing type: bandage and petrolatum    Specimen 1 - Surgical pathology Differential Diagnosis: SCC vs other Check Margins: No EDC today  AK (actinic keratosis) (18) Scalp, ears, hands  Actinic Damage - Severe, confluent actinic changes with pre-cancerous actinic keratoses  - Severe, chronic, not at goal, secondary to cumulative UV radiation exposure over time - diffuse scaly erythematous macules and papules with underlying dyspigmentation - Discussed Prescription "Field Treatment" for Severe, Chronic Confluent Actinic Changes with Pre-Cancerous Actinic Keratoses Field treatment involves treatment of an entire area of skin that has confluent Actinic Changes (Sun/ Ultraviolet light damage) and PreCancerous Actinic Keratoses by method of PhotoDynamic Therapy (PDT) and/or prescription Topical Chemotherapy agents such as 5-fluorouracil, 5-fluorouracil/calcipotriene, and/or imiquimod.  The purpose is to decrease the number of clinically evident and subclinical PreCancerous lesions to prevent progression to development of skin cancer by chemically destroying early precancer changes that may or may not be visible.  It has been shown to reduce the risk of developing skin cancer in the treated area. As a result  of treatment, redness, scaling, crusting, and open sores may occur during  treatment course. One or more than one of these methods may be used and may have to be used several times to control, suppress and eliminate the PreCancerous changes. Discussed treatment course, expected reaction, and possible side effects. - Recommend daily broad spectrum sunscreen SPF 30+ to sun-exposed areas, reapply every 2 hours as needed.  - Staying in the shade or wearing long sleeves, sun glasses (UVA+UVB protection) and wide brim hats (4-inch brim around the entire circumference of the hat) are also recommended. - Call for new or changing lesions.  In one month, start 5% fluorouracil/calcipotriene cream bid x 7 days to scalp, temples and hands - patient has medication at home.  Destruction of lesion - Scalp, ears, hands Complexity: simple   Destruction method: cryotherapy   Informed consent: discussed and consent obtained   Timeout:  patient name, date of birth, surgical site, and procedure verified Lesion destroyed using liquid nitrogen: Yes   Region frozen until ice ball extended beyond lesion: Yes   Outcome: patient tolerated procedure well with no complications   Post-procedure details: wound care instructions given    Inflamed seborrheic keratosis Right distal dorsum forearm x 1, left distal dorsum forearm x 1  Inflamed SK vs Wart - Recheck on follow up  Destruction of lesion - Right distal dorsum forearm x 1, left distal dorsum forearm x 1 Complexity: simple   Destruction method: cryotherapy   Informed consent: discussed and consent obtained   Timeout:  patient name, date of birth, surgical site, and procedure verified Lesion destroyed using liquid nitrogen: Yes   Region frozen until ice ball extended beyond lesion: Yes   Outcome: patient tolerated procedure well with no complications   Post-procedure details: wound care instructions given    Return in about 3 months (around 04/07/2021) for AK follow up.  I, Ashok Cordia, CMA, am acting as scribe for Sarina Ser, MD  . Documentation: I have reviewed the above documentation for accuracy and completeness, and I agree with the above.  Sarina Ser, MD

## 2021-01-05 NOTE — Patient Instructions (Addendum)
In one month, start 5% fluorouracil/calcipotriene cream bid x 7 days to scalp, temples and hands    Wound Care Instructions  Cleanse wound gently with soap and water once a day then pat dry with clean gauze. Apply a thing coat of Petrolatum (petroleum jelly, "Vaseline") over the wound (unless you have an allergy to this). We recommend that you use a new, sterile tube of Vaseline. Do not pick or remove scabs. Do not remove the yellow or white "healing tissue" from the base of the wound.  Cover the wound with fresh, clean, nonstick gauze and secure with paper tape. You may use Band-Aids in place of gauze and tape if the would is small enough, but would recommend trimming much of the tape off as there is often too much. Sometimes Band-Aids can irritate the skin.  You should call the office for your biopsy report after 1 week if you have not already been contacted.  If you experience any problems, such as abnormal amounts of bleeding, swelling, significant bruising, significant pain, or evidence of infection, please call the office immediately.  FOR ADULT SURGERY PATIENTS: If you need something for pain relief you may take 1 extra strength Tylenol (acetaminophen) AND 2 Ibuprofen ('200mg'$  each) together every 4 hours as needed for pain. (do not take these if you are allergic to them or if you have a reason you should not take them.) Typically, you may only need pain medication for 1 to 3 days.     Cryotherapy Aftercare  Wash gently with soap and water everyday.   Apply Vaseline and Band-Aid daily until healed.    If you have any questions or concerns for your doctor, please call our main line at [phone] and press option 4 to reach your doctor's medical assistant. If no one answers, please leave a voicemail as directed and we will return your call as soon as possible. Messages left after 4 pm will be answered the following business day.   You may also send [place] a message via [place]. We  typically respond to MyChart messages within 1-2 business days.  For prescription refills, please ask your pharmacy to contact our office. Our fax number is [phone].  If you have an urgent issue when the clinic is closed that cannot wait until the next business day, you can page your doctor at the number below.    Please note that while we do our best to be available for urgent issues outside of office hours, we are not available 24/7.   If you have an urgent issue and are unable to reach [place], you may choose to seek medical care at your doctor's office, retail clinic, urgent care center, or emergency room.  If you have a medical emergency, please immediately call 911 or go to the emergency department.  Pager Numbers  - Dr. Nehemiah Massed: [phone]  - Dr. Laurence Ferrari: [phone]  - Dr. Nicole Kindred: [phone]  In the event of inclement weather, please call our main line at [phone] for an update on the status of any delays or closures.  Dermatology Medication Tips: Please keep the boxes that topical medications come in in order to help keep track of the instructions about where and how to use these. Pharmacies typically print the medication instructions only on the boxes and not directly on the medication tubes.   If your medication is too expensive, please contact our office at [phone] option 4 or send [place] a message through [place].   We are unable to  tell what your co-pay for medications will be in advance as this is different depending on your insurance coverage. However, we may be able to find a substitute medication at lower cost or fill out paperwork to get insurance to cover a needed medication.   If a prior authorization is required to get your medication covered by your insurance company, please allow [place] 1-2 business days to complete this process.  Drug prices often vary depending on where the prescription is filled and some pharmacies may offer cheaper prices.  The  website www.goodrx.com contains coupons for medications through different pharmacies. The prices here do not account for what the cost may be with help from insurance (it may be cheaper with your insurance), but the website can give you the price if you did not use any insurance.  - You can print the associated coupon and take it with your prescription to the pharmacy.  - You may also stop by our office during regular business hours and pick up a GoodRx coupon card.  - If you need your prescription sent electronically to a different pharmacy, notify our office through [facility] or by phone at [phone] option 4.

## 2021-01-06 ENCOUNTER — Encounter: Payer: Self-pay | Admitting: Dermatology

## 2021-01-08 ENCOUNTER — Telehealth: Payer: Self-pay

## 2021-01-08 NOTE — Telephone Encounter (Signed)
Advised patient of results/hd  

## 2021-01-08 NOTE — Telephone Encounter (Signed)
-----   Message from Ralene Bathe, MD sent at 01/07/2021  6:22 PM EDT ----- Diagnosis Skin , right forearm WELL DIFFERENTIATED SQUAMOUS CELL CARCINOMA WITH SUPERFICIAL INFILTRATION  Cancer - SCC Already treated Recheck next visit

## 2021-03-30 ENCOUNTER — Other Ambulatory Visit: Payer: Self-pay

## 2021-03-30 ENCOUNTER — Ambulatory Visit: Payer: Managed Care, Other (non HMO) | Admitting: Dermatology

## 2021-03-30 DIAGNOSIS — Z86007 Personal history of in-situ neoplasm of skin: Secondary | ICD-10-CM | POA: Diagnosis not present

## 2021-03-30 DIAGNOSIS — L570 Actinic keratosis: Secondary | ICD-10-CM

## 2021-03-30 DIAGNOSIS — L578 Other skin changes due to chronic exposure to nonionizing radiation: Secondary | ICD-10-CM

## 2021-03-30 NOTE — Patient Instructions (Addendum)
- Start 5-fluorouracil/calcipotriene cream twice a day for 7 days to affected areas including face, scalp, hands. Prescription sent to [facility]. Patient advised they will receive an email to purchase the medication online and have it sent to their home. Patient provided with handout reviewing treatment course and side effects and advised to call or message [place] on MyChart with any concerns.  Recommend repeating treatment 1-2 times to face and scalp.  Instructions for [facility] Medications  One or more of your medications was sent to the [facility] mail order compounding pharmacy. You will receive an email from them and can purchase the medicine through that link. It will then be mailed to your home at the address you confirmed. If for any reason you do not receive an email from them, please check your spam folder. If you still do not find the email, please let us know. [facility] phone number is [phone].  If you have any questions or concerns for your doctor, please call our main line at [phone] and press option 4 to reach your doctor's medical assistant. If no one answers, please leave a voicemail as directed and we will return your call as soon as possible. Messages left after 4 pm will be answered the following business day.   You may also send [place] a message via [place]. We typically respond to MyChart messages within 1-2 business days.  For prescription refills, please ask your pharmacy to contact our office. Our fax number is [phone].  If you have an urgent issue when the clinic is closed that cannot wait until the next business day, you can page your doctor at the number below.    Please note that while we do our best to be available for urgent issues outside of office hours, we are not available 24/7.   If you have an urgent issue and are unable to reach [place], you may choose to seek medical care at your doctor's office, retail clinic,  urgent care center, or emergency room.  If you have a medical emergency, please immediately call 911 or go to the emergency department.  Pager Numbers  - Dr. Nehemiah Massed: [phone]  - Dr. Laurence Ferrari: [phone]  - Dr. Nicole Kindred: [phone]  In the event of inclement weather, please call our main line at [phone] for an update on the status of any delays or closures.  Dermatology Medication Tips: Please keep the boxes that topical medications come in in order to help keep track of the instructions about where and how to use these. Pharmacies typically print the medication instructions only on the boxes and not directly on the medication tubes.   If your medication is too expensive, please contact our office at [phone] option 4 or send [place] a message through [place].   We are unable to tell what your co-pay for medications will be in advance as this is different depending on your insurance coverage. However, we may be able to find a substitute medication at lower cost or fill out paperwork to get insurance to cover a needed medication.   If a prior authorization is required to get your medication covered by your insurance company, please allow [place] 1-2 business days to complete this process.  Drug prices often vary depending on where the prescription is filled and some pharmacies may offer cheaper prices.  The website www.goodrx.com contains coupons for medications through different pharmacies. The prices here do not account for what the cost may be with help from insurance (  it may be cheaper with your insurance), but the website can give you the price if you did not use any insurance.  - You can print the associated coupon and take it with your prescription to the pharmacy.  - You may also stop by our office during regular business hours and pick up a GoodRx coupon card.  - If you need your prescription sent electronically to a different pharmacy, notify our office through [facility] or by phone at [phone] option 4.

## 2021-03-30 NOTE — Progress Notes (Signed)
   Follow-Up Visit   Subjective  Calvin Peters is a 50 y.o. male who presents for the following: Follow-up (Patient here today for 3 month AK follow up. Patient used 5FU/calcipotriene twice daily for 5 days to face, scalp and hands last week. There is a spot under eye that he said scabbed up after doing topical. 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  The following portions of the chart were reviewed this encounter and updated as appropriate:   Tobacco  Allergies  Meds  Problems  Med Hx  Surg Hx  Fam Hx     Review of Systems:  No other skin or systemic complaints except as noted in HPI or Assessment and Plan.  Objective  Well appearing patient in no apparent distress; mood and affect are within normal limits.  A focused examination was performed including face, arms, hands, scalp. Relevant physical exam findings are noted in the Assessment and Plan.   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 - Severe, confluent actinic changes with pre-cancerous actinic keratoses  - Severe, chronic, not at goal, secondary to cumulative UV radiation exposure over time - diffuse scaly erythematous macules and papules with underlying dyspigmentation - Discussed Prescription "Field Treatment" for Severe, Chronic Confluent Actinic Changes with Pre-Cancerous Actinic Keratoses Field treatment involves treatment of an entire area of skin that has confluent Actinic Changes (Sun/ Ultraviolet light damage) and PreCancerous Actinic Keratoses by method of PhotoDynamic Therapy (PDT) and/or prescription Topical Chemotherapy agents such as 5-fluorouracil, 5-fluorouracil/calcipotriene, and/or imiquimod.  The purpose is to decrease the number of clinically evident and subclinical PreCancerous lesions to prevent progression to development of skin cancer by chemically destroying early precancer changes that may or may not be visible.  It has been shown to  reduce the risk of developing skin cancer in the treated area. As a result of treatment, redness, scaling, crusting, and open sores may occur during treatment course. One or more than one of these methods may be used and may have to be used several times to control, suppress and eliminate the PreCancerous changes. Discussed treatment course, expected reaction, and possible side effects. - Recommend daily broad spectrum sunscreen SPF 30+ to sun-exposed areas, reapply every 2 hours as needed.  - Staying in the shade or wearing long sleeves, sun glasses (UVA+UVB protection) and wide brim hats (4-inch brim around the entire circumference of the hat) are also recommended. - Call for new or changing lesions.  - Start 5-fluorouracil/calcipotriene cream twice a day for 7 days to affected areas including face, scalp, hands. Prescription sent to [facility]. Patient advised they will receive an email to purchase the medication online and have it sent to their home. Patient provided with handout reviewing treatment course and side effects and advised to call or message [place] on MyChart with any concerns.  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  - Call if any new or changing lesions are noted between office visits  Return for AK follow up 4-6 months.  Graciella Belton, RMA, am acting as scribe for Sarina Ser, MD . Documentation: I have reviewed the above documentation for accuracy and completeness, and I agree with the above.  Sarina Ser, MD

## 2021-04-05 ENCOUNTER — Encounter: Payer: Self-pay | Admitting: Dermatology

## 2021-07-28 ENCOUNTER — Ambulatory Visit: Payer: Managed Care, Other (non HMO) | Admitting: Dermatology

## 2021-07-28 ENCOUNTER — Encounter: Payer: Self-pay | Admitting: Dermatology

## 2021-07-28 ENCOUNTER — Other Ambulatory Visit: Payer: Self-pay

## 2021-07-28 DIAGNOSIS — L578 Other skin changes due to chronic exposure to nonionizing radiation: Secondary | ICD-10-CM | POA: Diagnosis not present

## 2021-07-28 DIAGNOSIS — L570 Actinic keratosis: Secondary | ICD-10-CM | POA: Diagnosis not present

## 2021-07-28 NOTE — Patient Instructions (Signed)
Cryotherapy Aftercare ? ?Wash gently with soap and water everyday.   ?Apply Vaseline and Band-Aid daily until healed.  ? ?Prior to procedure, discussed risks of blister formation, small wound, skin dyspigmentation, or rare scar following cryotherapy. Recommend Vaseline ointment to treated areas while healing.  ? ?If You Need Anything After Your Visit ? ?If you have any questions or concerns for your doctor, please call our main line at [phone] and press option 4 to reach your doctor's medical assistant. If no one answers, please leave a voicemail as directed and we will return your call as soon as possible. Messages left after 4 pm will be answered the following business day.  ? ?You may also send [place] a message via MyChart. We typically respond to MyChart messages within 1-2 business days. ? ?For prescription refills, please ask your pharmacy to contact our office. Our fax number is [phone]. ? ?If you have an urgent issue when the clinic is closed that cannot wait until the next business day, you can page your doctor at the number below.   ? ?Please note that while we do our best to be available for urgent issues outside of office hours, we are not available 24/7.  ? ?If you have an urgent issue and are unable to reach [place], you may choose to seek medical care at your doctor's office, retail clinic, urgent care center, or emergency room. ? ?If you have a medical emergency, please immediately call 911 or go to the emergency department. ? ?Pager Numbers ? ?- Dr. Nehemiah Massed: [phone] ? ?- Dr. Laurence Ferrari: [phone] ? ?- Dr. Nicole Kindred: [phone] ? ?In the event of inclement weather, please call our main line at [phone] for an update on the status of any delays or closures. ? ?Dermatology Medication Tips: ?Please keep the boxes that topical medications come in in order to help keep track of the instructions about where and how to use these. Pharmacies typically print the medication instructions only on the  boxes and not directly on the medication tubes.  ? ?If your medication is too expensive, please contact our office at [phone] option 4 or send [place] a message through [place].  ? ?We are unable to tell what your co-pay for medications will be in advance as this is different depending on your insurance coverage. However, we may be able to find a substitute medication at lower cost or fill out paperwork to get insurance to cover a needed medication.  ? ?If a prior authorization is required to get your medication covered by your insurance company, please allow [place] 1-2 business days to complete this process. ? ?Drug prices often vary depending on where the prescription is filled and some pharmacies may offer cheaper prices. ? ?The website www.goodrx.com contains coupons for medications through different pharmacies. The prices here do not account for what the cost may be with help from insurance (it may be cheaper with your insurance), but the website can give you the price if you did not use any insurance.  ?- You can print the associated coupon and take it with your prescription to the pharmacy.  ?- You may also stop by our office during regular business hours and pick up a GoodRx coupon card.  ?- If you need your prescription sent electronically to a different pharmacy, notify our office through [facility] or by phone at [phone] option 4. ? ? ? ? ?Si Usted Necesita Algo Despu?s de Su Visita ? ?Tambi?n puede enviarnos un mensaje a trav?s  de MyChart. Por lo general respondemos a los mensajes de MyChart en el transcurso de 1 a 2 d?as h?biles. ? ?Para renovar recetas, por favor pida a su farmacia que se ponga en contacto con nuestra oficina. Nuestro n?mero de fax es el [phone]. ? ?Si tiene un asunto urgente cuando la cl?nica est? cerrada y que no puede esperar hasta el siguiente d?a h?bil, puede llamar/localizar a su doctor(a) al n?mero que aparece a continuaci?n.  ? ?Por favor, tenga en cuenta que  aunque hacemos todo lo posible para estar disponibles para asuntos urgentes fuera del horario de oficina, no estamos disponibles las 24 horas del d?a, los 7 d?as de la semana.  ? ?Si tiene un problema urgente y no puede comunicarse con nosotros, puede optar por buscar atenci?n m?dica  en el consultorio de su doctor(a), en una cl?nica privada, en un centro de atenci?n urgente o en una sala de emergencias. ? ?Si tiene Engineer, maintenance (IT) m?dica, por favor llame inmediatamente al 911 o vaya a la sala de emergencias. ? ?N?meros de b?per ? ?- Dr. Nehemiah Massed: [phone] ? ?- Dra. Moye: [phone] ? ?- Dra. Nicole Kindred: [phone] ? ?En caso de inclemencias del tiempo, por favor llame a nuestra l?nea principal al [phone] para una actualizaci?n sobre el estado de cualquier retraso o cierre. ? ?Consejos para la medicaci?n en dermatolog?a: ?Por favor, guarde las cajas en las que vienen los medicamentos de uso t?pico para ayudarle a seguir las instrucciones sobre d?nde y c?mo usarlos. Las farmacias generalmente imprimen las instrucciones del medicamento s?lo en las cajas y no directamente en los tubos del [place].  ? ?Si su medicamento es muy caro, por favor, p?ngase en contacto con Zigmund Daniel llamando al [phone] y presione la opci?n 4 o env?enos un mensaje a trav?s de MyChart.  ? ?No podemos decirle cu?l ser? su copago por los medicamentos por adelantado ya que esto es diferente dependiendo de la cobertura de su seguro. Sin embargo, es posible que podamos encontrar un medicamento sustituto a Electrical engineer un formulario para que el seguro cubra el medicamento que se considera necesario.  ? ?Si se requiere Ardelia Mems autorizaci?n previa para que su compa??a de seguros [place] su medicamento, por favor perm?tanos de 1 a 2 d?as h?biles para completar este proceso. ? ?Los precios de los medicamentos var?an con frecuencia dependiendo del Environmental consultant de d?nde se surte la receta y alguna farmacias pueden ofrecer precios m?s  baratos. ? ?El sitio web www.goodrx.com tiene cupones para medicamentos de Airline pilot. Los precios aqu? no tienen en cuenta lo que podr?a costar con la ayuda del seguro (puede ser m?s barato con su seguro), pero el sitio web puede darle el precio si no utiliz? ning?n seguro.  ?- Puede imprimir el cup?n correspondiente y llevarlo con su receta a la farmacia.  ?- Tambi?n puede pasar por nuestra oficina durante el horario de atenci?n regular y recoger una tarjeta de cupones de GoodRx.  ?- Si necesita que su receta se env?e electr?nicamente a Chiropodist, informe a nuestra oficina a trav?s de MyChart de [place] o por tel?fono llamando al [phone] y presione la opci?n 4.  ?

## 2021-07-28 NOTE — Progress Notes (Signed)
? ?Follow-Up Visit ?  ?Subjective  ?Calvin Peters is a 51 y.o. male who presents for the following: Actinic Keratosis (4 month recheck. status post 5FU/Calcipotriene tx to scalp, face, hands. Last treatment was 3 weeks ago). ?The patient has spots, moles and lesions to be evaluated, some may be new or changing and the patient has concerns that these could be cancer. ? ?The following portions of the chart were reviewed this encounter and updated as appropriate:  Tobacco  Allergies  Meds  Problems  Med Hx  Surg Hx  Fam Hx   ?  ?Review of Systems: No other skin or systemic complaints except as noted in HPI or Assessment and Plan. ? ?Objective  ?Well appearing patient in no apparent distress; mood and affect are within normal limits. ? ?A focused examination was performed including scalp, face, neck, hands. Relevant physical exam findings are noted in the Assessment and Plan. ? ?right face x4 (4) ?Erythematous thin papules/macules with gritty scale.  ? ? ?Assessment & Plan  ? ?Actinic Damage - Severe, confluent actinic changes with pre-cancerous actinic keratoses  ?- Severe, chronic, not at goal, secondary to cumulative UV radiation exposure over time ?- diffuse scaly erythematous macules and papules with underlying dyspigmentation ?- Discussed Prescription "Field Treatment" for Severe, Chronic Confluent Actinic Changes with Pre-Cancerous Actinic Keratoses ?Field treatment involves treatment of an entire area of skin that has confluent Actinic Changes (Sun/ Ultraviolet light damage) and PreCancerous Actinic Keratoses by method of PhotoDynamic Therapy (PDT) and/or prescription Topical Chemotherapy agents such as 5-fluorouracil, 5-fluorouracil/calcipotriene, and/or imiquimod.  The purpose is to decrease the number of clinically evident and subclinical PreCancerous lesions to prevent progression to development of skin cancer by chemically destroying early precancer changes that may or may not be visible.  It has  been shown to reduce the risk of developing skin cancer in the treated area. As a result of treatment, redness, scaling, crusting, and open sores may occur during treatment course. One or more than one of these methods may be used and may have to be used several times to control, suppress and eliminate the PreCancerous changes. Discussed treatment course, expected reaction, and possible side effects. ?- Recommend daily broad spectrum sunscreen SPF 30+ to sun-exposed areas, reapply every 2 hours as needed.  ?- Staying in the shade or wearing long sleeves, sun glasses (UVA+UVB protection) and wide brim hats (4-inch brim around the entire circumference of the hat) are also recommended. ?- Call for new or changing lesions.  ? ?- Start 5-fluorouracil/calcipotriene cream twice a day for 7 days to affected areas including face, scalp, hands. Prescription sent to [facility]. Patient advised they will receive an email to purchase the medication online and have it sent to their home. Patient provided with handout reviewing treatment course and side effects and advised to call or message [place] on MyChart with any concerns. ?Patient states he will repeat 5FU calcipotriene treatment end of April, early May.  ? ?AK (actinic keratosis) (4) ?right face x4 ? ?Actinic keratoses are precancerous spots that appear secondary to cumulative UV radiation exposure/sun exposure over time. They are chronic with expected duration over 1 year. A portion of actinic keratoses will progress to squamous cell carcinoma of the skin. It is not possible to reliably predict which spots will progress to skin cancer and so treatment is recommended to prevent development of skin cancer. ? ?Recommend daily broad spectrum sunscreen SPF 30+ to sun-exposed areas, reapply every 2 hours as needed.  ?Recommend  staying in the shade or wearing long sleeves, sun glasses (UVA+UVB protection) and wide brim hats (4-inch brim around the entire  circumference of the hat). ?Call for new or changing lesions. ? ?Destruction of lesion - right face x4 ?Complexity: simple   ?Destruction method: cryotherapy   ?Informed consent: discussed and consent obtained   ?Timeout:  patient name, date of birth, surgical site, and procedure verified ?Lesion destroyed using liquid nitrogen: Yes   ?Region frozen until ice ball extended beyond lesion: Yes   ?Outcome: patient tolerated procedure well with no complications   ?Post-procedure details: wound care instructions given   ? ? ?Return in about 6 months (around 01/28/2022) for AK Follow Up. ? ?I, Emelia Salisbury, CMA, am acting as scribe for Sarina Ser, MD. ?Documentation: I have reviewed the above documentation for accuracy and completeness, and I agree with the above. ? ?Sarina Ser, MD ? ? ?

## 2021-08-06 ENCOUNTER — Encounter: Payer: Self-pay | Admitting: Dermatology

## 2021-12-29 DIAGNOSIS — Z85828 Personal history of other malignant neoplasm of skin: Secondary | ICD-10-CM | POA: Insufficient documentation

## 2022-02-22 ENCOUNTER — Ambulatory Visit: Payer: Managed Care, Other (non HMO) | Admitting: Dermatology

## 2022-05-19 ENCOUNTER — Encounter: Payer: Self-pay | Admitting: Dermatology

## 2022-05-19 ENCOUNTER — Ambulatory Visit: Payer: Managed Care, Other (non HMO) | Admitting: Dermatology

## 2022-05-19 VITALS — BP 145/96 | HR 59

## 2022-05-19 DIAGNOSIS — Z79899 Other long term (current) drug therapy: Secondary | ICD-10-CM | POA: Diagnosis not present

## 2022-05-19 DIAGNOSIS — C44310 Basal cell carcinoma of skin of unspecified parts of face: Secondary | ICD-10-CM

## 2022-05-19 DIAGNOSIS — C44319 Basal cell carcinoma of skin of other parts of face: Secondary | ICD-10-CM | POA: Diagnosis not present

## 2022-05-19 DIAGNOSIS — L820 Inflamed seborrheic keratosis: Secondary | ICD-10-CM

## 2022-05-19 DIAGNOSIS — L570 Actinic keratosis: Secondary | ICD-10-CM

## 2022-05-19 DIAGNOSIS — C4491 Basal cell carcinoma of skin, unspecified: Secondary | ICD-10-CM

## 2022-05-19 DIAGNOSIS — L821 Other seborrheic keratosis: Secondary | ICD-10-CM

## 2022-05-19 DIAGNOSIS — D492 Neoplasm of unspecified behavior of bone, soft tissue, and skin: Secondary | ICD-10-CM

## 2022-05-19 DIAGNOSIS — L578 Other skin changes due to chronic exposure to nonionizing radiation: Secondary | ICD-10-CM | POA: Diagnosis not present

## 2022-05-19 DIAGNOSIS — Z5111 Encounter for antineoplastic chemotherapy: Secondary | ICD-10-CM

## 2022-05-19 HISTORY — DX: Basal cell carcinoma of skin, unspecified: C44.91

## 2022-05-19 NOTE — Progress Notes (Unsigned)
Follow-Up Visit   Subjective  Calvin Peters is a 52 y.o. male who presents for the following: Actinic Keratosis (Face, scalp, hands, arms Pt used 5FU/Calcipotriene since last visit to these areas) and check spots (L temple, ~87mSpot on R infraocular that keeps coming back, hx of SCC IS R lower eyelid infraorbital area/R postauricular, just noticed, itchy/Chest, just noticed, no symptoms). The patient has spots, moles and lesions to be evaluated, some may be new or changing and the patient has concerns that these could be cancer.   The following portions of the chart were reviewed this encounter and updated as appropriate:   Tobacco  Allergies  Meds  Problems  Med Hx  Surg Hx  Fam Hx     Review of Systems:  No other skin or systemic complaints except as noted in HPI or Assessment and Plan.  Objective  Well appearing patient in no apparent distress; mood and affect are within normal limits.  A focused examination was performed including face, scalp, neck, chest, arms, hands. Relevant physical exam findings are noted in the Assessment and Plan.  R infraorbital 4.01mpink patch     R mastoid x 2 (2) Pink scaly macules  R post auricular x 1, L temple x 1, sternum  x 2 (4) Stuck on waxy paps with erythema   Assessment & Plan   Actinic Damage with PreCancerous Actinic Keratoses Counseling for Topical Chemotherapy Management: Patient exhibits: - Severe, confluent actinic changes with pre-cancerous actinic keratoses that is secondary to cumulative UV radiation exposure over time - Condition that is severe; chronic, not at goal. - diffuse scaly erythematous macules and papules with underlying dyspigmentation - Discussed Prescription "Field Treatment" topical Chemotherapy for Severe, Chronic Confluent Actinic Changes with Pre-Cancerous Actinic Keratoses Field treatment involves treatment of an entire area of skin that has confluent Actinic Changes (Sun/ Ultraviolet light  damage) and PreCancerous Actinic Keratoses by method of PhotoDynamic Therapy (PDT) and/or prescription Topical Chemotherapy agents such as 5-fluorouracil, 5-fluorouracil/calcipotriene, and/or imiquimod.  The purpose is to decrease the number of clinically evident and subclinical PreCancerous lesions to prevent progression to development of skin cancer by chemically destroying early precancer changes that may or may not be visible.  It has been shown to reduce the risk of developing skin cancer in the treated area. As a result of treatment, redness, scaling, crusting, and open sores may occur during treatment course. One or more than one of these methods may be used and may have to be used several times to control, suppress and eliminate the PreCancerous changes. Discussed treatment course, expected reaction, and possible side effects. - Recommend daily broad spectrum sunscreen SPF 30+ to sun-exposed areas, reapply every 2 hours as needed.  - Staying in the shade or wearing long sleeves, sun glasses (UVA+UVB protection) and wide brim hats (4-inch brim around the entire circumference of the hat) are also recommended. - Call for new or changing lesions.  -- Start 5-fluorouracil/calcipotriene cream twice a day for 7 days to affected areas including scalp, face and hands.  May treat scalp and face at different times. Prescription originally sent to [facility] has refill. Patient advised they will receive an email to purchase the medication online and have it sent to their home. Patient provided with handout reviewing treatment course and side effects and advised to call or message [place] MyChart with any concerns.  Reviewed course of treatment and expected reaction.  Patient advised to expect inflammation and crusting and advised  that erosions are possible.  Patient advised to be diligent with sun protection during and after treatment. Counseled to keep medication out of reach of  children and pets.   Neoplasm of skin R infraorbital Epidermal / dermal shaving  Lesion diameter (cm):  0.4 Informed consent: discussed and consent obtained   Timeout: patient name, date of birth, surgical site, and procedure verified   Procedure prep:  Patient was prepped and draped in usual sterile fashion Prep type:  Isopropyl alcohol Anesthesia: the lesion was anesthetized in a standard fashion   Anesthetic:  1% lidocaine w/ epinephrine 1-100,000 buffered w/ 8.4% NaHCO3 Instrument used: flexible razor blade   Hemostasis achieved with: pressure, aluminum chloride and electrodesiccation   Outcome: patient tolerated procedure well   Post-procedure details: sterile dressing applied and wound care instructions given   Dressing type: bandage and petrolatum    Destruction of lesion Complexity: extensive   Destruction method: electrodesiccation and curettage   Informed consent: discussed and consent obtained   Timeout:  patient name, date of birth, surgical site, and procedure verified Procedure prep:  Patient was prepped and draped in usual sterile fashion Prep type:  Isopropyl alcohol Anesthesia: the lesion was anesthetized in a standard fashion   Anesthetic:  1% lidocaine w/ epinephrine 1-100,000 buffered w/ 8.4% NaHCO3 Curettage performed in three different directions: Yes   Electrodesiccation performed over the curetted area: Yes   Lesion length (cm):  0.4 Lesion width (cm):  0.4 Margin per side (cm):  0.2 Final wound size (cm):  0.8 Hemostasis achieved with:  pressure, aluminum chloride and electrodesiccation Outcome: patient tolerated procedure well with no complications   Post-procedure details: sterile dressing applied and wound care instructions given   Dressing type: bandage and petrolatum    Specimen 1 - Surgical pathology Differential Diagnosis: D48.5 Recurrent SCC IS vs other Check Margins: No 4.78m pink patch, small specimen  EDC today  AK (actinic keratosis)  (2) R mastoid x 2 Good reaction from previous 5FU/Calcipotriene cream treatment  Plan to treat area w 5FU when doing scalp, face and hands.  Destruction of lesion - R mastoid x 2 Complexity: simple   Destruction method: cryotherapy   Informed consent: discussed and consent obtained   Timeout:  patient name, date of birth, surgical site, and procedure verified Lesion destroyed using liquid nitrogen: Yes   Region frozen until ice ball extended beyond lesion: Yes   Outcome: patient tolerated procedure well with no complications   Post-procedure details: wound care instructions given    Inflamed seborrheic keratosis (4) R post auricular x 1, L temple x 1, sternum  x 2 Symptomatic, irritating, patient would like treated. Destruction of lesion - R post auricular x 1, L temple x 1, sternum  x 2 Complexity: simple   Destruction method: cryotherapy   Informed consent: discussed and consent obtained   Timeout:  patient name, date of birth, surgical site, and procedure verified Lesion destroyed using liquid nitrogen: Yes   Region frozen until ice ball extended beyond lesion: Yes   Outcome: patient tolerated procedure well with no complications   Post-procedure details: wound care instructions given    Seborrheic Keratoses - Stuck-on, waxy, tan-brown papules and/or plaques  - Benign-appearing - Discussed benign etiology and prognosis. - Observe - Call for any changes  Actinic Damage - chronic, secondary to cumulative UV radiation exposure/sun exposure over time - diffuse scaly erythematous macules with underlying dyspigmentation - Recommend daily broad spectrum sunscreen SPF 30+ to sun-exposed areas, reapply every 2  hours as needed.  - Recommend staying in the shade or wearing long sleeves, sun glasses (UVA+UVB protection) and wide brim hats (4-inch brim around the entire circumference of the hat). - Call for new or changing lesions.  Return in about 6 months (around 11/17/2022) for  UBSE, Hx of SCC, Hx of AKs.  I, Othelia Pulling, RMA, am acting as scribe for Sarina Ser, MD . Documentation: I have reviewed the above documentation for accuracy and completeness, and I agree with the above.  Sarina Ser, MD

## 2022-05-19 NOTE — Patient Instructions (Signed)
Wound Care Instructions  Cleanse wound gently with soap and water once a day then pat dry with clean gauze. Apply a thin coat of Petrolatum (petroleum jelly, "Vaseline") over the wound (unless you have an allergy to this). We recommend that you use a new, sterile tube of Vaseline. Do not pick or remove scabs. Do not remove the yellow or white "healing tissue" from the base of the wound.  Cover the wound with fresh, clean, nonstick gauze and secure with paper tape. You may use Band-Aids in place of gauze and tape if the wound is small enough, but would recommend trimming much of the tape off as there is often too much. Sometimes Band-Aids can irritate the skin.  You should call the office for your biopsy report after 1 week if you have not already been contacted.  If you experience any problems, such as abnormal amounts of bleeding, swelling, significant bruising, significant pain, or evidence of infection, please call the office immediately.  FOR ADULT SURGERY PATIENTS: If you need something for pain relief you may take 1 extra strength Tylenol (acetaminophen) AND 2 Ibuprofen ('200mg'$  each) together every 4 hours as needed for pain. (do not take these if you are allergic to them or if you have a reason you should not take them.) Typically, you may only need pain medication for 1 to 3 days.    Cryotherapy Aftercare  Wash gently with soap and water everyday.   Apply Vaseline and Band-Aid daily until healed.   - Start 5-fluorouracil/calcipotriene cream twice a day for 7 days to affected areas including scalp, face and hands.  May treat scalp and face at different times. Patient advised they will receive an email to purchase the medication online and have it sent to their home. Patient provided with handout reviewing treatment course and side effects and advised to call or message [place] on MyChart with any concerns.  Reviewed course of treatment and expected reaction.  Patient advised to expect  inflammation and crusting and advised that erosions are possible.  Patient advised to be diligent with sun protection during and after treatment. Counseled to keep medication out of reach of children and pets.   5-Fluorouracil/Calcipotriene Patient Education   Actinic keratoses are the dry, red scaly spots on the skin caused by sun damage. A portion of these spots can turn into skin cancer with time, and treating them can help prevent development of skin cancer.   Treatment of these spots requires removal of the defective skin cells. There are various ways to remove actinic keratoses, including freezing with liquid nitrogen, treatment with creams, or treatment with a blue light procedure in the office.   5-fluorouracil cream is a topical cream used to treat actinic keratoses. It works by interfering with the growth of abnormal fast-growing skin cells, such as actinic keratoses. These cells peel off and are replaced by healthy ones.   5-fluorouracil/calcipotriene is a combination of the 5-fluorouracil cream with a vitamin D analog cream called calcipotriene. The calcipotriene alone does not treat actinic keratoses. However, when it is combined with 5-fluorouracil, it helps the 5-fluorouracil treat the actinic keratoses much faster so that the same results can be achieved with a much shorter treatment time.  INSTRUCTIONS FOR 5-FLUOROURACIL/CALCIPOTRIENE CREAM:   5-fluorouracil/calcipotriene cream typically only needs to be used for 4-7 days. A thin layer should be applied twice a day to the treatment areas recommended by your physician.   If your physician prescribed you separate tubes of 5-fluourouracil and calcipotriene,  apply a thin layer of 5-fluorouracil followed by a thin layer of calcipotriene.   Avoid contact with your eyes, nostrils, and mouth. Do not use 5-fluorouracil/calcipotriene cream on infected or open wounds.   You will develop redness, irritation and some crusting at areas where  you have pre-cancer damage/actinic keratoses. IF YOU DEVELOP PAIN, BLEEDING, OR SIGNIFICANT CRUSTING, STOP THE TREATMENT EARLY - you have already gotten a good response and the actinic keratoses should clear up well.  Wash your hands after applying 5-fluorouracil 5% cream on your skin.   A moisturizer or sunscreen with a minimum SPF 30 should be applied each morning.   Once you have finished the treatment, you can apply a thin layer of Vaseline twice a day to irritated areas to soothe and calm the areas more quickly. If you experience significant discomfort, contact your physician.  For some patients it is necessary to repeat the treatment for best results.  SIDE EFFECTS: When using 5-fluorouracil/calcipotriene cream, you may have mild irritation, such as redness, dryness, swelling, or a mild burning sensation. This usually resolves within 2 weeks. The more actinic keratoses you have, the more redness and inflammation you can expect during treatment. Eye irritation has been reported rarely. If this occurs, please let us know.  If you have any trouble using this cream, please call the office. If you have any other questions about this information, please do not hesitate to ask me before you leave the office.  Due to recent changes in healthcare laws, you may see results of your pathology and/or laboratory studies on MyChart before the doctors have had a chance to review them. We understand that in some cases there may be results that are confusing or concerning to you. Please understand that not all results are received at the same time and often the doctors may need to interpret multiple results in order to provide you with the best plan of care or course of treatment. Therefore, we ask that you please give [place] 2 business days to thoroughly review all your results before contacting the office for clarification. Should we see a critical lab result, you will be contacted sooner.   If You Need Anything  After Your Visit  If you have any questions or concerns for your doctor, please call our main line at [phone] and press option 4 to reach your doctor's medical assistant. If no one answers, please leave a voicemail as directed and we will return your call as soon as possible. Messages left after 4 pm will be answered the following business day.   You may also send [place] a message via [place]. We typically respond to MyChart messages within 1-2 business days.  For prescription refills, please ask your pharmacy to contact our office. Our fax number is [phone].  If you have an urgent issue when the clinic is closed that cannot wait until the next business day, you can page your doctor at the number below.    Please note that while we do our best to be available for urgent issues outside of office hours, we are not available 24/7.   If you have an urgent issue and are unable to reach [place], you may choose to seek medical care at your doctor's office, retail clinic, urgent care center, or emergency room.  If you have a medical emergency, please immediately call 911 or go to the emergency department.  Pager Numbers  - Dr. Nehemiah Massed: [phone]  - Dr. Laurence Ferrari: [phone]  - Dr. Nicole Kindred:  [phone]  In the event of inclement weather, please call our main line at [phone] for an update on the status of any delays or closures.  Dermatology Medication Tips: Please keep the boxes that topical medications come in in order to help keep track of the instructions about where and how to use these. Pharmacies typically print the medication instructions only on the boxes and not directly on the medication tubes.   If your medication is too expensive, please contact our office at [phone] option 4 or send [place] a message through [place].   We are unable to tell what your co-pay for medications will be in advance as this is different depending on your insurance coverage. However, we may be able  to find a substitute medication at lower cost or fill out paperwork to get insurance to cover a needed medication.   If a prior authorization is required to get your medication covered by your insurance company, please allow [place] 1-2 business days to complete this process.  Drug prices often vary depending on where the prescription is filled and some pharmacies may offer cheaper prices.  The website www.goodrx.com contains coupons for medications through different pharmacies. The prices here do not account for what the cost may be with help from insurance (it may be cheaper with your insurance), but the website can give you the price if you did not use any insurance.  - You can print the associated coupon and take it with your prescription to the pharmacy.  - You may also stop by our office during regular business hours and pick up a GoodRx coupon card.  - If you need your prescription sent electronically to a different pharmacy, notify our office through [facility] or by phone at [phone] option 4.     Si Usted Necesita Algo Despus de Su Visita  Tambin puede enviarnos un mensaje a travs de Pharmacist, community. Por lo general respondemos a los mensajes de MyChart en el transcurso de 1 a 2 das hbiles.  Para renovar recetas, por favor pida a su farmacia que se ponga en contacto con nuestra oficina. Harland Dingwall de fax es [place] [phone].  Si tiene un asunto urgente cuando la clnica est cerrada y que no puede esperar hasta el siguiente da hbil, puede llamar/localizar a su doctor(a) al nmero que aparece a continuacin.   Por favor, tenga en cuenta que aunque hacemos todo lo posible para estar disponibles para asuntos urgentes fuera del horario de [place], no estamos disponibles las 24 horas del da, los 7 das de la [place].   Si tiene un problema urgente y no puede comunicarse con nosotros, puede optar por buscar atencin mdica  en el consultorio de su doctor(a), en una clnica  privada, en un centro de atencin urgente o en una sala de emergencias.  Si tiene Engineering geologist, por favor llame inmediatamente al 911 o vaya a la sala de emergencias.  Nmeros de bper  - Dr. Nehemiah Massed: [phone]  - Dra. Moye: [phone]  - Dra. Nicole Kindred: [phone]  En caso de inclemencias del [place], por favor llame a Johnsie Kindred principal al [phone] para una actualizacin sobre el [place] de cualquier retraso o cierre.  Consejos para la medicacin en dermatologa: Por favor, guarde las cajas en las que vienen los medicamentos de uso tpico para ayudarle a seguir las instrucciones sobre dnde y cmo usarlos. Las farmacias generalmente imprimen las instrucciones del medicamento slo en las cajas y no directamente en los tubos del [place].  Si su medicamento es muy caro, por favor, pngase en contacto con Zigmund Daniel llamando al [phone] y presione la opcin 4 o envenos un mensaje a travs de Pharmacist, community.   No podemos decirle cul ser su copago por los medicamentos por adelantado ya que esto es diferente dependiendo de la cobertura de su seguro. Sin embargo, es posible que podamos encontrar un medicamento sustituto a Electrical engineer un formulario para que el seguro cubra el medicamento que se considera necesario.   Si se requiere una autorizacin previa para que su compaa de seguros [place] su medicamento, por favor permtanos de 1 a 2 das hbiles para completar este proceso.  Los precios de los medicamentos varan con frecuencia dependiendo del Environmental consultant de dnde se surte la receta y alguna farmacias pueden ofrecer precios ms baratos.  El sitio web www.goodrx.com tiene cupones para medicamentos de Airline pilot. Los precios aqu no tienen en cuenta lo que podra costar con la ayuda del seguro (puede ser ms barato con su seguro), pero el sitio web puede darle el precio si no utiliz Research scientist (physical sciences).  - Puede imprimir el cupn correspondiente y  llevarlo con su receta a la farmacia.  - Tambin puede pasar por nuestra oficina durante el horario de atencin regular y Charity fundraiser una tarjeta de cupones de GoodRx.  - Si necesita que su receta se enve electrnicamente a una farmacia diferente, informe a nuestra oficina a travs de MyChart de [place] o por telfono llamando al [phone] y presione la opcin 4.

## 2022-05-21 ENCOUNTER — Encounter: Payer: Self-pay | Admitting: Dermatology

## 2022-05-25 ENCOUNTER — Telehealth: Payer: Self-pay

## 2022-05-25 NOTE — Telephone Encounter (Signed)
Patient informed of pathology results 

## 2022-05-25 NOTE — Telephone Encounter (Signed)
-----   Message from Ralene Bathe, MD sent at 05/25/2022 11:24 AM EST ----- Diagnosis Skin , right infraorbital BASAL CELL CARCINOMA, NODULAR PATTERN  Cancer - BCC Already treated  Recheck next visit

## 2022-11-11 ENCOUNTER — Encounter: Payer: Self-pay | Admitting: Dermatology

## 2022-11-11 ENCOUNTER — Ambulatory Visit: Payer: Managed Care, Other (non HMO) | Admitting: Dermatology

## 2022-11-11 VITALS — BP 124/79 | HR 64

## 2022-11-11 DIAGNOSIS — Z5111 Encounter for antineoplastic chemotherapy: Secondary | ICD-10-CM

## 2022-11-11 DIAGNOSIS — L820 Inflamed seborrheic keratosis: Secondary | ICD-10-CM | POA: Diagnosis not present

## 2022-11-11 DIAGNOSIS — L578 Other skin changes due to chronic exposure to nonionizing radiation: Secondary | ICD-10-CM | POA: Diagnosis not present

## 2022-11-11 DIAGNOSIS — L570 Actinic keratosis: Secondary | ICD-10-CM | POA: Diagnosis not present

## 2022-11-11 DIAGNOSIS — Z7189 Other specified counseling: Secondary | ICD-10-CM

## 2022-11-11 DIAGNOSIS — Z79899 Other long term (current) drug therapy: Secondary | ICD-10-CM

## 2022-11-11 DIAGNOSIS — L814 Other melanin hyperpigmentation: Secondary | ICD-10-CM

## 2022-11-11 DIAGNOSIS — L821 Other seborrheic keratosis: Secondary | ICD-10-CM

## 2022-11-11 DIAGNOSIS — Z85828 Personal history of other malignant neoplasm of skin: Secondary | ICD-10-CM

## 2022-11-11 DIAGNOSIS — W908XXA Exposure to other nonionizing radiation, initial encounter: Secondary | ICD-10-CM | POA: Diagnosis not present

## 2022-11-11 DIAGNOSIS — Z872 Personal history of diseases of the skin and subcutaneous tissue: Secondary | ICD-10-CM

## 2022-11-11 DIAGNOSIS — Z1283 Encounter for screening for malignant neoplasm of skin: Secondary | ICD-10-CM

## 2022-11-11 DIAGNOSIS — Z8589 Personal history of malignant neoplasm of other organs and systems: Secondary | ICD-10-CM

## 2022-11-11 DIAGNOSIS — D1801 Hemangioma of skin and subcutaneous tissue: Secondary | ICD-10-CM

## 2022-11-11 DIAGNOSIS — D229 Melanocytic nevi, unspecified: Secondary | ICD-10-CM

## 2022-11-11 NOTE — Progress Notes (Signed)
Follow-Up Visit   Subjective  Calvin Peters is a 52 y.o. male who presents for the following: Skin Cancer Screening and Upper Body Skin Exam. HxBCC, HxSCC, HxAKs. Has not started 5FU/Calcipotriene treatment yet.  Check spot on back of right leg.  The patient presents for Upper Body Skin Exam (UBSE) for skin cancer screening and mole check. The patient has spots, moles and lesions to be evaluated, some may be new or changing and the patient has concerns that these could be cancer.  The following portions of the chart were reviewed this encounter and updated as appropriate: medications, allergies, medical history  Review of Systems:  No other skin or systemic complaints except as noted in HPI or Assessment and Plan.  Objective  Well appearing patient in no apparent distress; mood and affect are within normal limits.  All skin waist up examined. Relevant physical exam findings are noted in the Assessment and Plan.  Right Popliteal Fossa x1, Right upper back, chest, arms x16 (16) Erythematous keratotic or waxy stuck-on papule or plaque.  Scalp x3 (3) Erythematous thin papules/macules with gritty scale.    Assessment & Plan   HISTORY OF BASAL CELL CARCINOMA OF THE SKIN. Right infraorbital. 05/19/2022 - No evidence of recurrence today - Recommend regular full body skin exams - Recommend daily broad spectrum sunscreen SPF 30+ to sun-exposed areas, reapply every 2 hours as needed.  - Call if any new or changing lesions are noted between office visits  HISTORY OF SQUAMOUS CELL CARCINOMA OF THE SKIN - No evidence of recurrence today - No lymphadenopathy - Recommend regular full body skin exams - Recommend daily broad spectrum sunscreen SPF 30+ to sun-exposed areas, reapply every 2 hours as needed.  - Call if any new or changing lesions are noted between office visits    Inflamed seborrheic keratosis (16) Right Popliteal Fossa x1, Right upper back, chest, arms x16  Symptomatic,  irritating, patient would like treated.  Destruction of lesion - Right Popliteal Fossa x1, Right upper back, chest, arms x16 Complexity: simple   Destruction method: cryotherapy   Informed consent: discussed and consent obtained   Timeout:  patient name, date of birth, surgical site, and procedure verified Lesion destroyed using liquid nitrogen: Yes   Region frozen until ice ball extended beyond lesion: Yes   Outcome: patient tolerated procedure well with no complications   Post-procedure details: wound care instructions given    AK (actinic keratosis) (3) Scalp x3  Actinic keratoses are precancerous spots that appear secondary to cumulative UV radiation exposure/sun exposure over time. They are chronic with expected duration over 1 year. A portion of actinic keratoses will progress to squamous cell carcinoma of the skin. It is not possible to reliably predict which spots will progress to skin cancer and so treatment is recommended to prevent development of skin cancer.  Recommend daily broad spectrum sunscreen SPF 30+ to sun-exposed areas, reapply every 2 hours as needed.  Recommend staying in the shade or wearing long sleeves, sun glasses (UVA+UVB protection) and wide brim hats (4-inch brim around the entire circumference of the hat). Call for new or changing lesions.  Destruction of lesion - Scalp x3 Complexity: simple   Destruction method: cryotherapy   Informed consent: discussed and consent obtained   Timeout:  patient name, date of birth, surgical site, and procedure verified Lesion destroyed using liquid nitrogen: Yes   Region frozen until ice ball extended beyond lesion: Yes   Outcome: patient tolerated procedure well with no complications  Post-procedure details: wound care instructions given   Additional details:  Prior to procedure, discussed risks of blister formation, small wound, skin dyspigmentation, or rare scar following cryotherapy. Recommend Vaseline ointment to  treated areas while healing.    Lentigines, Seborrheic Keratoses, Hemangiomas - Benign normal skin lesions - Benign-appearing - Call for any changes  Melanocytic Nevi - Tan-brown and/or pink-flesh-colored symmetric macules and papules - Benign appearing on exam today - Observation - Call clinic for new or changing moles - Recommend daily use of broad spectrum spf 30+ sunscreen to sun-exposed areas.   ACTINIC DAMAGE WITH PRECANCEROUS ACTINIC KERATOSES Counseling for Topical Chemotherapy Management: Patient exhibits: - Severe, confluent actinic changes with pre-cancerous actinic keratoses that is secondary to cumulative UV radiation exposure over time - Condition that is severe; chronic, not at goal. - diffuse scaly erythematous macules and papules with underlying dyspigmentation - Discussed Prescription "Field Treatment" topical Chemotherapy for Severe, Chronic Confluent Actinic Changes with Pre-Cancerous Actinic Keratoses Field treatment involves treatment of an entire area of skin that has confluent Actinic Changes (Sun/ Ultraviolet light damage) and PreCancerous Actinic Keratoses by method of PhotoDynamic Therapy (PDT) and/or prescription Topical Chemotherapy agents such as 5-fluorouracil, 5-fluorouracil/calcipotriene, and/or imiquimod.  The purpose is to decrease the number of clinically evident and subclinical PreCancerous lesions to prevent progression to development of skin cancer by chemically destroying early precancer changes that may or may not be visible.  It has been shown to reduce the risk of developing skin cancer in the treated area. As a result of treatment, redness, scaling, crusting, and open sores may occur during treatment course. One or more than one of these methods may be used and may have to be used several times to control, suppress and eliminate the PreCancerous changes. Discussed treatment course, expected reaction, and possible side effects. - Recommend daily broad  spectrum sunscreen SPF 30+ to sun-exposed areas, reapply every 2 hours as needed.  - Staying in the shade or wearing long sleeves, sun glasses (UVA+UVB protection) and wide brim hats (4-inch brim around the entire circumference of the hat) are also recommended. - Call for new or changing lesions.  - Start 5-fluorouracil/calcipotriene cream twice a day for 7 days to affected areas on face. Apply twice daily to scalp for 10 days.  May treat scalp and face at different times. Prescription originally sent to [facility], patient has refill. Patient advised they will receive an email to purchase the medication online and have it sent to their home. Patient provided with handout reviewing treatment course and side effects and advised to call or message [place] on MyChart with any concerns.   Reviewed course of treatment and expected reaction.  Patient advised to expect inflammation and crusting and advised that erosions are possible.  Patient advised to be diligent with sun protection during and after treatment. Counseled to keep medication out of reach of children and pets.   Skin cancer screening performed today.  Return in about 6 months (around 05/13/2023) for TBSE, AK Follow Up.  I, Lawson Radar, CMA, am acting as scribe for Armida Sans, MD.  Documentation: I have reviewed the above documentation for accuracy and completeness, and I agree with the above.  Armida Sans, MD

## 2022-11-11 NOTE — Patient Instructions (Addendum)
Cryotherapy Aftercare  Wash gently with soap and water everyday.   Apply Vaseline Jelly daily until healed.     Start 5-fluorouracil/calcipotriene cream twice a day for 7 days to affected areas on face. Apply twice daily to scalp for 10 days.  May treat scalp and face at different times. Prescription originally sent to [facility], patient has refill. Patient advised they will receive an email to purchase the medication online and have it sent to their home. Patient provided with handout reviewing treatment course and side effects and advised to call or message [place] on MyChart with any concerns.   Reviewed course of treatment and expected reaction.  Patient advised to expect inflammation and crusting and advised that erosions are possible.  Patient advised to be diligent with sun protection during and after treatment. Counseled to keep medication out of reach of children and pets.     Recommend daily broad spectrum sunscreen SPF 30+ to sun-exposed areas, reapply every 2 hours as needed. Call for new or changing lesions.  Staying in the shade or wearing long sleeves, sun glasses (UVA+UVB protection) and wide brim hats (4-inch brim around the entire circumference of the hat) are also recommended for sun protection.    Melanoma ABCDEs  Melanoma is the most dangerous type of skin cancer, and is the leading cause of death from skin disease.  You are more likely to develop melanoma if you: Have light-colored skin, light-colored eyes, or red or blond hair Spend a lot of time in the sun Tan regularly, either outdoors or in a tanning bed Have had blistering sunburns, especially during childhood Have a close family member who has had a melanoma Have atypical moles or large birthmarks  Early detection of melanoma is key since treatment is typically straightforward and cure rates are extremely high if we catch it early.   The first sign of melanoma is often a change in a mole  or a new dark spot.  The ABCDE system is a way of remembering the signs of melanoma.  A for asymmetry:  The two halves do not match. B for border:  The edges of the growth are irregular. C for color:  A mixture of colors are present instead of an even brown color. D for diameter:  Melanomas are usually (but not always) greater than 6mm - the size of a pencil eraser. E for evolution:  The spot keeps changing in size, shape, and color.  Please check your skin once per month between visits. You can use a small mirror in front and a large mirror behind you to keep an eye on the back side or your body.   If you see any new or changing lesions before your next follow-up, please call to schedule a visit.  Please continue daily skin protection including broad spectrum sunscreen SPF 30+ to sun-exposed areas, reapplying every 2 hours as needed when you're outdoors.   Staying in the shade or wearing long sleeves, sun glasses (UVA+UVB protection) and wide brim hats (4-inch brim around the entire circumference of the hat) are also recommended for sun protection.    Due to recent changes in healthcare laws, you may see results of your pathology and/or laboratory studies on MyChart before the doctors have had a chance to review them. We understand that in some cases there may be results that are confusing or concerning to you. Please understand that not all results are received at the same time and often the doctors may  need to interpret multiple results in order to provide you with the best plan of care or course of treatment. Therefore, we ask that you please give [place] 2 business days to thoroughly review all your results before contacting the office for clarification. Should we see a critical lab result, you will be contacted sooner.   If You Need Anything After Your Visit  If you have any questions or concerns for your doctor, please call our main line at [phone] and press option 4 to reach your  doctor's medical assistant. If no one answers, please leave a voicemail as directed and we will return your call as soon as possible. Messages left after 4 pm will be answered the following business day.   You may also send [place] a message via MyChart. We typically respond to MyChart messages within 1-2 business days.  For prescription refills, please ask your pharmacy to contact our office. Our fax number is [phone].  If you have an urgent issue when the clinic is closed that cannot wait until the next business day, you can page your doctor at the number below.    Please note that while we do our best to be available for urgent issues outside of office hours, we are not available 24/7.   If you have an urgent issue and are unable to reach [place], you may choose to seek medical care at your doctor's office, retail clinic, urgent care center, or emergency room.  If you have a medical emergency, please immediately call 911 or go to the emergency department.  Pager Numbers  - Dr. Gwen Pounds: [phone]  - Dr. Neale Burly: [phone]  - Dr. Roseanne Reno: [phone]  In the event of inclement weather, please call our main line at [phone] for an update on the status of any delays or closures.  Dermatology Medication Tips: Please keep the boxes that topical medications come in in order to help keep track of the instructions about where and how to use these. Pharmacies typically print the medication instructions only on the boxes and not directly on the medication tubes.   If your medication is too expensive, please contact our office at [phone] option 4 or send [place] a message through MyChart.   We are unable to tell what your co-pay for medications will be in advance as this is different depending on your insurance coverage. However, we may be able to find a substitute medication at lower cost or fill out paperwork to get insurance to cover a needed medication.   If a prior authorization is  required to get your medication covered by your insurance company, please allow [place] 1-2 business days to complete this process.  Drug prices often vary depending on where the prescription is filled and some pharmacies may offer cheaper prices.  The website www.goodrx.com contains coupons for medications through different pharmacies. The prices here do not account for what the cost may be with help from insurance (it may be cheaper with your insurance), but the website can give you the price if you did not use any insurance.  - You can print the associated coupon and take it with your prescription to the pharmacy.  - You may also stop by our office during regular business hours and pick up a GoodRx coupon card.  - If you need your prescription sent electronically to a different pharmacy, notify our office through [facility] or by phone at [phone] option 4.     Si Usted Necesita Algo Despus de  Su Visita  Tambin puede enviarnos un mensaje a travs de MyChart. Por lo general respondemos a los mensajes de MyChart en el transcurso de 1 a 2 das hbiles.  Para renovar recetas, por favor pida a su farmacia que se ponga en contacto con nuestra oficina. Annie Sable de fax es [place] [phone].  Si tiene un asunto urgente cuando la clnica est cerrada y que no puede esperar hasta el siguiente da hbil, puede llamar/localizar a su doctor(a) al nmero que aparece a continuacin.   Por favor, tenga en cuenta que aunque hacemos todo lo posible para estar disponibles para asuntos urgentes fuera del horario de [place], no estamos disponibles las 24 horas del da, los 7 [address]  [address] de la [place].   Si tiene un problema urgente y no puede comunicarse con nosotros, puede optar por buscar atencin mdica  en el consultorio de su doctor(a), en una clnica privada, en un centro de atencin urgente o en una sala de emergencias.  Si tiene Engineer, drilling, por favor llame inmediatamente al 911 o vaya a  la sala de emergencias.  Nmeros de bper  - Dr. Gwen Pounds: [phone]  - Dra. Moye: [phone]  - Dra. Roseanne Reno: [phone]  En caso de inclemencias del [place], por favor llame a Lacy Duverney principal al [phone] para una actualizacin sobre el [place] de cualquier retraso o cierre.  Consejos para la medicacin en dermatologa: Por favor, guarde las cajas en las que vienen los medicamentos de uso tpico para ayudarle a seguir las instrucciones sobre dnde y cmo usarlos. Las farmacias generalmente imprimen las instrucciones del medicamento slo en las cajas y no directamente en los tubos del [place].   Si su medicamento es muy caro, por favor, pngase en contacto con Rolm Gala llamando al [phone] y presione la opcin 4 o envenos un mensaje a travs de Clinical cytogeneticist.   No podemos decirle cul ser su copago por los medicamentos por adelantado ya que esto es diferente dependiendo de la cobertura de su seguro. Sin embargo, es posible que podamos encontrar un medicamento sustituto a Audiological scientist un formulario para que el seguro cubra el medicamento que se considera necesario.   Si se requiere una autorizacin previa para que su compaa de seguros [place] su medicamento, por favor permtanos de 1 a 2 das hbiles para completar [address]  Los precios de los medicamentos varan con frecuencia dependiendo del Environmental consultant de dnde se surte la receta y alguna farmacias pueden ofrecer precios ms baratos.  El sitio web www.goodrx.com tiene cupones para medicamentos de Health and safety inspector. Los precios aqu no tienen en cuenta lo que podra costar con la ayuda del seguro (puede ser ms barato con su seguro), pero el sitio web puede darle el precio si no utiliz Tourist information centre manager.  - Puede imprimir el cupn correspondiente y llevarlo con su receta a la farmacia.  - Tambin puede pasar por nuestra oficina durante el horario de atencin regular y Education officer, museum una tarjeta de cupones de  GoodRx.  - Si necesita que su receta se enve electrnicamente a una farmacia diferente, informe a nuestra oficina a travs de MyChart de [place] o por telfono llamando al [phone] y presione la opcin 4.

## 2022-11-13 ENCOUNTER — Encounter: Payer: Self-pay | Admitting: Dermatology

## 2023-01-20 ENCOUNTER — Encounter: Payer: Self-pay | Admitting: Dermatology

## 2023-01-20 ENCOUNTER — Ambulatory Visit: Payer: Managed Care, Other (non HMO) | Admitting: Dermatology

## 2023-01-20 VITALS — BP 124/80 | HR 68

## 2023-01-20 DIAGNOSIS — L080 Pyoderma: Secondary | ICD-10-CM

## 2023-01-20 DIAGNOSIS — L309 Dermatitis, unspecified: Secondary | ICD-10-CM

## 2023-01-20 DIAGNOSIS — R21 Rash and other nonspecific skin eruption: Secondary | ICD-10-CM

## 2023-01-20 DIAGNOSIS — D492 Neoplasm of unspecified behavior of bone, soft tissue, and skin: Secondary | ICD-10-CM

## 2023-01-20 MED ORDER — MUPIROCIN 2 % EX OINT
TOPICAL_OINTMENT | CUTANEOUS | 1 refills | Status: DC
Start: 2023-01-20 — End: 2024-02-08

## 2023-01-20 NOTE — Progress Notes (Signed)
   Follow-Up Visit   Subjective  Calvin Peters is a 52 y.o. male who presents for the following: Spot on right leg. Dur: several weeks. Painful, but less painful than before. Has not drained any fluid. Does not remember insect bite to area.  Will not heal. Hx of skin cancers.   The patient has spots, moles and lesions to be evaluated, some may be new or changing and the patient may have concern these could be cancer.    The following portions of the chart were reviewed this encounter and updated as appropriate: medications, allergies, medical history  Review of Systems:  No other skin or systemic complaints except as noted in HPI or Assessment and Plan.  Objective  Well appearing patient in no apparent distress; mood and affect are within normal limits.  A focused examination was performed of the following areas: Right posterior thigh  Relevant physical exam findings are noted in the Assessment and Plan.  Right Thigh - Posterior indurated, somewhat fluctuant, erythematous plaque with thin scaling           Assessment & Plan   Neoplasm of skin Right Thigh - Posterior  Skin / nail biopsy Type of biopsy: punch   Informed consent: discussed and consent obtained   Patient was prepped and draped in usual sterile fashion: Area prepped with alcohol. Anesthesia: the lesion was anesthetized in a standard fashion   Anesthetic:  1% lidocaine w/ epinephrine 1-100,000 buffered w/ 8.4% NaHCO3 Punch size:  4 mm Hemostasis achieved with: pressure   Outcome: patient tolerated procedure well   Post-procedure details: wound care instructions given   Post-procedure details comment:  Ointment and small bandage applied  mupirocin ointment (BACTROBAN) 2 % Apply twice daily with bandage change  Specimen 1 - Surgical pathology Differential Diagnosis: arthropod bite vs abscess  Check Margins: No  Start Mupirocin ointment twice daily with bandage change  Related  Procedures Anaerobic and Aerobic Culture  Dermatitis  Related Procedures Anaerobic and Aerobic Culture  Rash       Return for Follow Up As Scheduled.  I, Lawson Radar, CMA, am acting as scribe for Elie Goody, MD.   Documentation: I have reviewed the above documentation for accuracy and completeness, and I agree with the above.  Elie Goody, MD

## 2023-01-20 NOTE — Patient Instructions (Signed)
Start Mupirocin ointment twice daily with bandage change   Wound Care Instructions  Cleanse wound gently with soap and water once a day then pat dry with clean gauze. Apply a thin coat of Petrolatum (petroleum jelly, "Vaseline") over the wound (unless you have an allergy to this). We recommend that you use a new, sterile tube of Vaseline. Do not pick or remove scabs. Do not remove the yellow or white "healing tissue" from the base of the wound.  Cover the wound with fresh, clean, nonstick gauze and secure with paper tape. You may use Band-Aids in place of gauze and tape if the wound is small enough, but would recommend trimming much of the tape off as there is often too much. Sometimes Band-Aids can irritate the skin.  You should call the office for your biopsy report after 1 week if you have not already been contacted.  If you experience any problems, such as abnormal amounts of bleeding, swelling, significant bruising, significant pain, or evidence of infection, please call the office immediately.  FOR ADULT SURGERY PATIENTS: If you need something for pain relief you may take 1 extra strength Tylenol (acetaminophen) AND 2 Ibuprofen (200mg  each) together every 4 hours as needed for pain. (do not take these if you are allergic to them or if you have a reason you should not take them.) Typically, you may only need pain medication for 1 to 3 days.    Due to recent changes in healthcare laws, you may see results of your pathology and/or laboratory studies on MyChart before the doctors have had a chance to review them. We understand that in some cases there may be results that are confusing or concerning to you. Please understand that not all results are received at the same time and often the doctors may need to interpret multiple results in order to provide you with the best plan of care or course of treatment. Therefore, we ask that you please give [place] 2 business days to thoroughly review all your  results before contacting the office for clarification. Should we see a critical lab result, you will be contacted sooner.   If You Need Anything After Your Visit  If you have any questions or concerns for your doctor, please call our main line at [phone] and press option 4 to reach your doctor's medical assistant. If no one answers, please leave a voicemail as directed and we will return your call as soon as possible. Messages left after 4 pm will be answered the following business day.   You may also send [place] a message via MyChart. We typically respond to MyChart messages within 1-2 business days.  For prescription refills, please ask your pharmacy to contact our office. Our fax number is [phone].  If you have an urgent issue when the clinic is closed that cannot wait until the next business day, you can page your doctor at the number below.    Please note that while we do our best to be available for urgent issues outside of office hours, we are not available 24/7.   If you have an urgent issue and are unable to reach [place], you may choose to seek medical care at your doctor's office, retail clinic, urgent care center, or emergency room.  If you have a medical emergency, please immediately call 911 or go to the emergency department.  Pager Numbers  - Dr. Gwen Pounds: [phone]  - Dr. Roseanne Reno: [phone]  - Dr. Katrinka Blazing: [phone]   In the  event of inclement weather, please call our main line at [phone] for an update on the status of any delays or closures.  Dermatology Medication Tips: Please keep the boxes that topical medications come in in order to help keep track of the instructions about where and how to use these. Pharmacies typically print the medication instructions only on the boxes and not directly on the medication tubes.   If your medication is too expensive, please contact our office at [phone] option 4 or send [place] a message through MyChart.   We are  unable to tell what your co-pay for medications will be in advance as this is different depending on your insurance coverage. However, we may be able to find a substitute medication at lower cost or fill out paperwork to get insurance to cover a needed medication.   If a prior authorization is required to get your medication covered by your insurance company, please allow [place] 1-2 business days to complete this process.  Drug prices often vary depending on where the prescription is filled and some pharmacies may offer cheaper prices.  The website www.goodrx.com contains coupons for medications through different pharmacies. The prices here do not account for what the cost may be with help from insurance (it may be cheaper with your insurance), but the website can give you the price if you did not use any insurance.  - You can print the associated coupon and take it with your prescription to the pharmacy.  - You may also stop by our office during regular business hours and pick up a GoodRx coupon card.  - If you need your prescription sent electronically to a different pharmacy, notify our office through [facility] or by phone at [phone] option 4.     Si Usted Necesita Algo Despus de Su Visita  Tambin puede enviarnos un mensaje a travs de Clinical cytogeneticist. Por lo general respondemos a los mensajes de MyChart en el transcurso de 1 a 2 das hbiles.  Para renovar recetas, por favor pida a su farmacia que se ponga en contacto con nuestra oficina. Annie Sable de fax es [place] [phone].  Si tiene un asunto urgente cuando la clnica est cerrada y que no puede esperar hasta el siguiente da hbil, puede llamar/localizar a su doctor(a) al nmero que aparece a continuacin.   Por favor, tenga en cuenta que aunque hacemos todo lo posible para estar disponibles para asuntos urgentes fuera del horario de [place], no estamos disponibles las 24 horas del da, los 7 [address]  [address] de la [place].   Si tiene un  problema urgente y no puede comunicarse con nosotros, puede optar por buscar atencin mdica  en el consultorio de su doctor(a), en una clnica privada, en un centro de atencin urgente o en una sala de emergencias.  Si tiene Engineer, drilling, por favor llame inmediatamente al 911 o vaya a la sala de emergencias.  Nmeros de bper  - Dr. Gwen Pounds: [phone]  - Dra. Roseanne Reno: [phone]  - Dr. Katrinka Blazing: [phone]   En caso de inclemencias del tiempo, por favor llame a Lacy Duverney principal al [phone] para una actualizacin sobre el [place] de cualquier retraso o cierre.  Consejos para la medicacin en dermatologa: Por favor, guarde las cajas en las que vienen los medicamentos de uso tpico para ayudarle a seguir las instrucciones sobre dnde y cmo usarlos. Las farmacias generalmente imprimen las instrucciones del medicamento slo en las cajas y no directamente en los tubos del [place].   Si  su medicamento es muy caro, por favor, pngase en contacto con Rolm Gala llamando al [phone] y presione la opcin 4 o envenos un mensaje a travs de Clinical cytogeneticist.   No podemos decirle cul ser su copago por los medicamentos por adelantado ya que esto es diferente dependiendo de la cobertura de su seguro. Sin embargo, es posible que podamos encontrar un medicamento sustituto a Audiological scientist un formulario para que el seguro cubra el medicamento que se considera necesario.   Si se requiere una autorizacin previa para que su compaa de seguros [place] su medicamento, por favor permtanos de 1 a 2 das hbiles para completar [address]  Los precios de los medicamentos varan con frecuencia dependiendo del Environmental consultant de dnde se surte la receta y alguna farmacias pueden ofrecer precios ms baratos.  El sitio web www.goodrx.com tiene cupones para medicamentos de Health and safety inspector. Los precios aqu no tienen en cuenta lo que podra costar con la ayuda del seguro (puede ser ms  barato con su seguro), pero el sitio web puede darle el precio si no utiliz Tourist information centre manager.  - Puede imprimir el cupn correspondiente y llevarlo con su receta a la farmacia.  - Tambin puede pasar por nuestra oficina durante el horario de atencin regular y Education officer, museum una tarjeta de cupones de GoodRx.  - Si necesita que su receta se enve electrnicamente a una farmacia diferente, informe a nuestra oficina a travs de MyChart de [place] o por telfono llamando al [phone] y presione la opcin 4.

## 2023-01-25 ENCOUNTER — Telehealth: Payer: Self-pay

## 2023-01-25 NOTE — Telephone Encounter (Signed)
Called patient. N/A. LMOVM to C/B 

## 2023-01-25 NOTE — Telephone Encounter (Signed)
-----   Message from [place] sent at 01/25/2023  3:44 PM EDT ----- Diagnosis Skin , right thigh - posterior SUPPURATIVE GRANULOMATOUS DERMATITIS   Please call with diagnosis and plan  Biopsy shows a pattern of inflammation that suggests there was a benign cyst that ruptured. We will wait to see if the culture grows anything before further instructions.

## 2023-01-27 ENCOUNTER — Telehealth: Payer: Self-pay

## 2023-01-27 LAB — ANAEROBIC AND AEROBIC CULTURE

## 2023-01-27 NOTE — Telephone Encounter (Signed)
Called patient. N/A. LMOVM to return my call regarding culture AND biopsy results

## 2023-01-27 NOTE — Telephone Encounter (Signed)
Patient advised of information and culture results. aw

## 2023-01-27 NOTE — Telephone Encounter (Signed)
-----   Message from [place] sent at 01/27/2023 11:02 AM EDT ----- Please call to share culture result (no growth) and get update on how patient is feeling. Thank you

## 2023-01-27 NOTE — Telephone Encounter (Signed)
Patient advised of culture result. aw

## 2023-04-25 ENCOUNTER — Ambulatory Visit: Payer: Managed Care, Other (non HMO)

## 2023-04-25 DIAGNOSIS — K5289 Other specified noninfective gastroenteritis and colitis: Secondary | ICD-10-CM | POA: Diagnosis present

## 2023-06-09 ENCOUNTER — Encounter: Payer: Self-pay | Admitting: Dermatology

## 2023-06-09 ENCOUNTER — Ambulatory Visit: Payer: Managed Care, Other (non HMO) | Admitting: Dermatology

## 2023-06-09 DIAGNOSIS — L905 Scar conditions and fibrosis of skin: Secondary | ICD-10-CM

## 2023-06-09 DIAGNOSIS — D099 Carcinoma in situ, unspecified: Secondary | ICD-10-CM

## 2023-06-09 DIAGNOSIS — C4441 Basal cell carcinoma of skin of scalp and neck: Secondary | ICD-10-CM | POA: Diagnosis not present

## 2023-06-09 DIAGNOSIS — Z1283 Encounter for screening for malignant neoplasm of skin: Secondary | ICD-10-CM | POA: Diagnosis not present

## 2023-06-09 DIAGNOSIS — D1801 Hemangioma of skin and subcutaneous tissue: Secondary | ICD-10-CM

## 2023-06-09 DIAGNOSIS — D492 Neoplasm of unspecified behavior of bone, soft tissue, and skin: Secondary | ICD-10-CM

## 2023-06-09 DIAGNOSIS — L578 Other skin changes due to chronic exposure to nonionizing radiation: Secondary | ICD-10-CM

## 2023-06-09 DIAGNOSIS — L570 Actinic keratosis: Secondary | ICD-10-CM | POA: Diagnosis not present

## 2023-06-09 DIAGNOSIS — C4442 Squamous cell carcinoma of skin of scalp and neck: Secondary | ICD-10-CM

## 2023-06-09 DIAGNOSIS — L821 Other seborrheic keratosis: Secondary | ICD-10-CM

## 2023-06-09 DIAGNOSIS — L814 Other melanin hyperpigmentation: Secondary | ICD-10-CM | POA: Diagnosis not present

## 2023-06-09 DIAGNOSIS — D489 Neoplasm of uncertain behavior, unspecified: Secondary | ICD-10-CM

## 2023-06-09 DIAGNOSIS — D229 Melanocytic nevi, unspecified: Secondary | ICD-10-CM

## 2023-06-09 DIAGNOSIS — D0471 Carcinoma in situ of skin of right lower limb, including hip: Secondary | ICD-10-CM

## 2023-06-09 DIAGNOSIS — W908XXA Exposure to other nonionizing radiation, initial encounter: Secondary | ICD-10-CM | POA: Diagnosis not present

## 2023-06-09 HISTORY — DX: Carcinoma in situ, unspecified: D09.9

## 2023-06-09 NOTE — Progress Notes (Signed)
Follow-Up Visit   Subjective  Calvin Peters is a 53 y.o. male who presents for the following: Skin Cancer Screening and Full Body Skin Exam hx of aks, hx of isks Right temple x 2, right forehead x 1 Hx of bcc hx of scc, hx of sccis Patient was given 51f/u calcipotriene cream to use at scalp in June 2024, reports used 1 time and did get very red and inflamed.   Patient reports sore spots at neck he would like checked states have been present for a while but just started getting painful recently Also has some areas at scalp he would like checked and spot at right thigh that comes and goes.   The patient presents for Total-Body Skin Exam (TBSE) for skin cancer screening and mole check. The patient has spots, moles and lesions to be evaluated, some may be new or changing and the patient may have concern these could be cancer.    The following portions of the chart were reviewed this encounter and updated as appropriate: medications, allergies, medical history  Review of Systems:  No other skin or systemic complaints except as noted in HPI or Assessment and Plan.  Objective  Well appearing patient in no apparent distress; mood and affect are within normal limits.  A full examination was performed including scalp, head, eyes, ears, nose, lips, neck, chest, axillae, abdomen, back, buttocks, bilateral upper extremities, bilateral lower extremities, hands, feet, fingers, toes, fingernails, and toenails. All findings within normal limits unless otherwise noted below.   Relevant physical exam findings are noted in the Assessment and Plan.  Right Posterior Neck 11 mm keratotic papule  left paraspinal Posterior Neck 5 mm pink macule with brown macule within  Right Thigh - Anterior 8 mm pink keratotic papule  Right Thigh - Posterior 4 mm keratotic papule with collarette of scale  Left Anterior Neck x 2, left temple x 2 , left antitragus x 1, left tragus x 1, left upper chest x 5, left  dorsal hand x 3, left forearm x 3, right dorsal hand x 2, left forearm x 3, right upper arm x 2, right chest x 1, right superior helix x 1, (28), right temple x 2, right forehead x 1 (3) Erythematous thin papules/macules with gritty scale.   Assessment & Plan   SKIN CANCER SCREENING PERFORMED TODAY.   LENTIGINES, SEBORRHEIC KERATOSES, HEMANGIOMAS - Benign normal skin lesions - Benign-appearing - Call for any changes  MELANOCYTIC NEVI - Tan-brown and/or pink-flesh-colored symmetric macules and papules - Benign appearing on exam today - Observation - Call clinic for new or changing moles - Recommend daily use of broad spectrum spf 30+ sunscreen to sun-exposed areas.   HISTORY OF SQUAMOUS CELL CARCINOMA OF THE SKIN Anterior vertex scalp ED&C 08/14/20 Right Forearm ED&C 01/05/21 - scar on exam - Recommend regular full body skin exams - Recommend daily broad spectrum sunscreen SPF 30+ to sun-exposed areas, reapply every 2 hours as needed.  - Call if any new or changing lesions are noted between office visits  HISTORY OF SQUAMOUS CELL CARCINOMA IN SITU OF THE SKIN Right lower eyelid/infraorbital area 03/2019 Right chest 09/2012 - scar on exam - Recommend regular full body skin exams - Recommend daily broad spectrum sunscreen SPF 30+ to sun-exposed areas, reapply every 2 hours as needed.  - Call if any new or changing lesions are noted between office visits  R post thigh ruptured cyst, bx proven  - well healed scar. Return if lesion recurs or  new lesion develops  HISTORY OF BASAL CELL CARCINOMA OF THE SKIN Right infraorbital ED&C  05/2022 - scar on exam - Recommend regular full body skin exams - Recommend daily broad spectrum sunscreen SPF 30+ to sun-exposed areas, reapply every 2 hours as needed.  - Call if any new or changing lesions are noted between office visits  NEOPLASM OF UNCERTAIN BEHAVIOR (4) Right Posterior Neck Skin / nail biopsy Type of biopsy: tangential    Informed consent: discussed and consent obtained   Timeout: patient name, date of birth, surgical site, and procedure verified   Procedure prep:  Patient was prepped and draped in usual sterile fashion Prep type:  Isopropyl alcohol Anesthesia: the lesion was anesthetized in a standard fashion   Anesthetic:  1% lidocaine w/ epinephrine 1-100,000 buffered w/ 8.4% NaHCO3 Instrument used: DermaBlade   Hemostasis achieved with: pressure and aluminum chloride   Outcome: patient tolerated procedure well   Post-procedure details: sterile dressing applied and wound care instructions given   Dressing type: bandage and petrolatum   Specimen 4 - Surgical pathology Differential Diagnosis: SCC  Check Margins: No left paraspinal Posterior Neck Skin / nail biopsy Type of biopsy: tangential   Informed consent: discussed and consent obtained   Timeout: patient name, date of birth, surgical site, and procedure verified   Procedure prep:  Patient was prepped and draped in usual sterile fashion Prep type:  Isopropyl alcohol Anesthesia: the lesion was anesthetized in a standard fashion   Anesthetic:  1% lidocaine w/ epinephrine 1-100,000 buffered w/ 8.4% NaHCO3 Instrument used: DermaBlade   Hemostasis achieved with: pressure and aluminum chloride   Outcome: patient tolerated procedure well   Post-procedure details: sterile dressing applied and wound care instructions given   Dressing type: bandage and petrolatum   Specimen 3 - Surgical pathology Differential Diagnosis: BCC vs SCC vs ISK  Check Margins: No Right Thigh - Anterior Skin / nail biopsy Type of biopsy: tangential   Informed consent: discussed and consent obtained   Timeout: patient name, date of birth, surgical site, and procedure verified   Procedure prep:  Patient was prepped and draped in usual sterile fashion Prep type:  Isopropyl alcohol Anesthesia: the lesion was anesthetized in a standard fashion   Anesthetic:  1% lidocaine w/  epinephrine 1-100,000 buffered w/ 8.4% NaHCO3 Instrument used: DermaBlade   Hemostasis achieved with: pressure and aluminum chloride   Outcome: patient tolerated procedure well   Post-procedure details: sterile dressing applied and wound care instructions given   Dressing type: bandage and petrolatum   Specimen 2 - Surgical pathology Differential Diagnosis: scc vs ISK  Check Margins: No Right Thigh - Posterior Skin / nail biopsy Type of biopsy: tangential   Informed consent: discussed and consent obtained   Timeout: patient name, date of birth, surgical site, and procedure verified   Procedure prep:  Patient was prepped and draped in usual sterile fashion Prep type:  Isopropyl alcohol Anesthesia: the lesion was anesthetized in a standard fashion   Anesthetic:  1% lidocaine w/ epinephrine 1-100,000 buffered w/ 8.4% NaHCO3 Instrument used: DermaBlade   Hemostasis achieved with: pressure and aluminum chloride   Outcome: patient tolerated procedure well   Post-procedure details: sterile dressing applied and wound care instructions given   Dressing type: bandage and petrolatum   Specimen 1 - Surgical pathology Differential Diagnosis: scc vs folliculitis  Check Margins: No 1 right posterior thigh - r/o scc vs folliculitis 2 right anterior thigh -  r/o  scc vs ISK 3 left  paraspinal posterior neck r/o BCC vs SCC vs ISK 4 right posterior neck - r/o SCC ACTINIC KERATOSIS (31) Left Anterior Neck x 2, left temple x 2 , left antitragus x 1, left tragus x 1, left upper chest x 5, left dorsal hand x 3, left forearm x 3, right dorsal hand x 2, left forearm x 3, right upper arm x 2, right chest x 1, right superior helix x 1, (28), right temple x 2, right forehead x 1 (3) Actinic keratoses are precancerous spots that appear secondary to cumulative UV radiation exposure/sun exposure over time. They are chronic with expected duration over 1 year. A portion of actinic keratoses will progress to squamous  cell carcinoma of the skin. It is not possible to reliably predict which spots will progress to skin cancer and so treatment is recommended to prevent development of skin cancer.  Recommend daily broad spectrum sunscreen SPF 30+ to sun-exposed areas, reapply every 2 hours as needed. Recommend staying in the shade or wearing long sleeves, sun glasses (UVA+UVB protection) and wide brim hats (4-inch brim around the entire circumference of the hat). Call for new or changing lesions.   ACTINIC DAMAGE WITH PRECANCEROUS ACTINIC KERATOSES Counseling for Topical Chemotherapy Management: Patient exhibits: - Severe, confluent actinic changes with pre-cancerous actinic keratoses that is secondary to cumulative UV radiation exposure over time - Condition that is severe; chronic, not at goal. - diffuse scaly erythematous macules and papules with underlying dyspigmentation - Discussed Prescription "Field Treatment" topical Chemotherapy for Severe, Chronic Confluent Actinic Changes with Pre-Cancerous Actinic Keratoses Field treatment involves treatment of an entire area of skin that has confluent Actinic Changes (Sun/ Ultraviolet light damage) and PreCancerous Actinic Keratoses by method of PhotoDynamic Therapy (PDT) and/or prescription Topical Chemotherapy agents such as 5-fluorouracil, 5-fluorouracil/calcipotriene, and/or imiquimod.  The purpose is to decrease the number of clinically evident and subclinical PreCancerous lesions to prevent progression to development of skin cancer by chemically destroying early precancer changes that may or may not be visible.  It has been shown to reduce the risk of developing skin cancer in the treated area. As a result of treatment, redness, scaling, crusting, and open sores may occur during treatment course. One or more than one of these methods may be used and may have to be used several times to control, suppress and eliminate the PreCancerous changes. Discussed treatment  course, expected reaction, and possible side effects. - Recommend daily broad spectrum sunscreen SPF 30+ to sun-exposed areas, reapply every 2 hours as needed.  - Staying in the shade or wearing long sleeves, sun glasses (UVA+UVB protection) and wide brim hats (4-inch brim around the entire circumference of the hat) are also recommended. - Call for new or changing lesions. - patient will do 5FU calcipotriene on scalp dorsal hands face forearms   Destruction of lesion - Left Anterior Neck x 2, left temple x 2 , left antitragus x 1, left tragus x 1, left upper chest x 5, left dorsal hand x 3, left forearm x 3, right dorsal hand x 2, left forearm x 3, right upper arm x 2, right chest x 1, right superior helix x 1, (28) Complexity: simple   Destruction method: cryotherapy   Informed consent: discussed and consent obtained   Timeout:  patient name, date of birth, surgical site, and procedure verified Lesion destroyed using liquid nitrogen: Yes   Region frozen until ice ball extended beyond lesion: Yes   Cryo cycles: 1 or 2. Outcome: patient tolerated procedure well  with no complications   Post-procedure details: wound care instructions given   MULTIPLE BENIGN NEVI   LENTIGINES   SEBORRHEIC KERATOSES   ACTINIC ELASTOSIS   CHERRY ANGIOMA   Return in about 6 months (around 12/07/2023) for TBSE hx bcc, hx scc with Dr. Verdell Face, Asher Muir, CMA, am acting as scribe for Elie Goody, MD.   Documentation: I have reviewed the above documentation for accuracy and completeness, and I agree with the above.  Elie Goody, MD

## 2023-06-09 NOTE — Patient Instructions (Addendum)
Biopsy Wound Care Instructions  Leave the original bandage on for 24 hours if possible.  If the bandage becomes soaked or soiled before that time, it is OK to remove it and examine the wound.  A small amount of post-operative bleeding is normal.  If excessive bleeding occurs, remove the bandage, place gauze over the site and apply continuous pressure (no peeking) over the area for 30 minutes. If this does not work, please call our clinic as soon as possible or page your doctor if it is after hours.   Once a day, cleanse the wound with soap and water. It is fine to shower. If a thick crust develops you may use a Q-tip dipped into dilute hydrogen peroxide (mix 1:1 with water) to dissolve it.  Hydrogen peroxide can slow the healing process, so use it only as needed.    After washing, apply petroleum jelly (Vaseline) or an antibiotic ointment if your doctor prescribed one for you, followed by a bandage.    For best healing, the wound should be covered with a layer of ointment at all times. If you are not able to keep the area covered with a bandage to hold the ointment in place, this may mean re-applying the ointment several times a day.  Continue this wound care until the wound has healed and is no longer open.   Itching and mild discomfort is normal during the healing process. However, if you develop pain or severe itching, please call our office.   If you have any discomfort, you can take Tylenol (acetaminophen) or ibuprofen as directed on the bottle. (Please do not take these if you have an allergy to them or cannot take them for another reason).  Some redness, tenderness and white or yellow material in the wound is normal healing.  If the area becomes very sore and red, or develops a thick yellow-green material (pus), it may be infected; please notify us.    If you have stitches, return to clinic as directed to have the stitches removed. You will continue wound care for 2-3 days after the stitches  are removed.   Wound healing continues for up to one year following surgery. It is not unusual to experience pain in the scar from time to time during the interval.  If the pain becomes severe or the scar thickens, you should notify the office.    A slight amount of redness in a scar is expected for the first six months.  After six months, the redness will fade and the scar will soften and fade.  The color difference becomes less noticeable with time.  If there are any problems, return for a post-op surgery check at your earliest convenience.  To improve the appearance of the scar, you can use silicone scar gel, cream, or Shatzer (such as Mederma or Serica) every night for up to one year. These are available over the counter (without a prescription).  Please call our office at [phone] for any questions or concerns.    Restart -  5-fluorouracil/calcipotriene cream twice a day for 7 days to affected areas including scalp and face can also use at back of hands. Prescription sent to [facility].  Reviewed course of treatment and expected reaction.  Patient advised to expect inflammation and crusting and advised that erosions are possible.  Patient advised to be diligent with sun protection during and after treatment. Counseled to keep medication out of reach of children and pets.   5-Fluorouracil/Calcipotriene Patient  Education   Actinic keratoses are the dry, red scaly spots on the skin caused by sun damage. A portion of these spots can turn into skin cancer with time, and treating them can help prevent development of skin cancer.   Treatment of these spots requires removal of the defective skin cells. There are various ways to remove actinic keratoses, including freezing with liquid nitrogen, treatment with creams, or treatment with a blue light procedure in the office.   5-fluorouracil cream is a topical cream used to treat actinic keratoses. It works by interfering  with the growth of abnormal fast-growing skin cells, such as actinic keratoses. These cells peel off and are replaced by healthy ones.   5-fluorouracil/calcipotriene is a combination of the 5-fluorouracil cream with a vitamin D analog cream called calcipotriene. The calcipotriene alone does not treat actinic keratoses. However, when it is combined with 5-fluorouracil, it helps the 5-fluorouracil treat the actinic keratoses much faster so that the same results can be achieved with a much shorter treatment time.  INSTRUCTIONS FOR 5-FLUOROURACIL/CALCIPOTRIENE CREAM:   5-fluorouracil/calcipotriene cream typically only needs to be used for 4-7 days. A thin layer should be applied twice a day to the treatment areas recommended by your physician.   If your physician prescribed you separate tubes of 5-fluourouracil and calcipotriene, apply a thin layer of 5-fluorouracil followed by a thin layer of calcipotriene.   Avoid contact with your eyes, nostrils, and mouth. Do not use 5-fluorouracil/calcipotriene cream on infected or open wounds.   You will develop redness, irritation and some crusting at areas where you have pre-cancer damage/actinic keratoses. IF YOU DEVELOP PAIN, BLEEDING, OR SIGNIFICANT CRUSTING, STOP THE TREATMENT EARLY - you have already gotten a good response and the actinic keratoses should clear up well.  Wash your hands after applying 5-fluorouracil 5% cream on your skin.   A moisturizer or sunscreen with a minimum SPF 30 should be applied each morning.   Once you have finished the treatment, you can apply a thin layer of Vaseline twice a day to irritated areas to soothe and calm the areas more quickly. If you experience significant discomfort, contact your physician.  For some patients it is necessary to repeat the treatment for best results.  SIDE EFFECTS: When using 5-fluorouracil/calcipotriene cream, you may have mild irritation, such as redness, dryness, swelling, or a mild  burning sensation. This usually resolves within 2 weeks. The more actinic keratoses you have, the more redness and inflammation you can expect during treatment. Eye irritation has been reported rarely. If this occurs, please let us know.  If you have any trouble using this cream, please call the office. If you have any other questions about this information, please do not hesitate to ask me before you leave the office.    Actinic keratoses are precancerous spots that appear secondary to cumulative UV radiation exposure/sun exposure over time. They are chronic with expected duration over 1 year. A portion of actinic keratoses will progress to squamous cell carcinoma of the skin. It is not possible to reliably predict which spots will progress to skin cancer and so treatment is recommended to prevent development of skin cancer.  Recommend daily broad spectrum sunscreen SPF 30+ to sun-exposed areas, reapply every 2 hours as needed.  Recommend staying in the shade or wearing long sleeves, sun glasses (UVA+UVB protection) and wide brim hats (4-inch brim around the entire circumference of the hat). Call for new or changing lesions.   Cryotherapy Aftercare  Wash gently with  soap and water everyday.   Apply Vaseline and Band-Aid daily until healed.     Melanoma ABCDEs  Melanoma is the most dangerous type of skin cancer, and is the leading cause of death from skin disease.  You are more likely to develop melanoma if you: Have light-colored skin, light-colored eyes, or red or blond hair Spend a lot of time in the sun Tan regularly, either outdoors or in a tanning bed Have had blistering sunburns, especially during childhood Have a close family member who has had a melanoma Have atypical moles or large birthmarks  Early detection of melanoma is key since treatment is typically straightforward and cure rates are extremely high if we catch it early.   The first sign of melanoma is often a change in  a mole or a new dark spot.  The ABCDE system is a way of remembering the signs of melanoma.  A for asymmetry:  The two halves do not match. B for border:  The edges of the growth are irregular. C for color:  A mixture of colors are present instead of an even brown color. D for diameter:  Melanomas are usually (but not always) greater than 6mm - the size of a pencil eraser. E for evolution:  The spot keeps changing in size, shape, and color.  Please check your skin once per month between visits. You can use a small mirror in front and a large mirror behind you to keep an eye on the back side or your body.   If you see any new or changing lesions before your next follow-up, please call to schedule a visit.  Please continue daily skin protection including broad spectrum sunscreen SPF 30+ to sun-exposed areas, reapplying every 2 hours as needed when you're outdoors.   Staying in the shade or wearing long sleeves, sun glasses (UVA+UVB protection) and wide brim hats (4-inch brim around the entire circumference of the hat) are also recommended for sun protection.    Due to recent changes in healthcare laws, you may see results of your pathology and/or laboratory studies on MyChart before the doctors have had a chance to review them. We understand that in some cases there may be results that are confusing or concerning to you. Please understand that not all results are received at the same time and often the doctors may need to interpret multiple results in order to provide you with the best plan of care or course of treatment. Therefore, we ask that you please give [place] 2 business days to thoroughly review all your results before contacting the office for clarification. Should we see a critical lab result, you will be contacted sooner.   If You Need Anything After Your Visit  If you have any questions or concerns for your doctor, please call our main line at [phone] and press option 4 to reach your  doctor's medical assistant. If no one answers, please leave a voicemail as directed and we will return your call as soon as possible. Messages left after 4 pm will be answered the following business day.   You may also send [place] a message via MyChart. We typically respond to MyChart messages within 1-2 business days.  For prescription refills, please ask your pharmacy to contact our office. Our fax number is [phone].  If you have an urgent issue when the clinic is closed that cannot wait until the next business day, you can page your doctor at the number below.    Please  note that while we do our best to be available for urgent issues outside of office hours, we are not available 24/7.   If you have an urgent issue and are unable to reach [place], you may choose to seek medical care at your doctor's office, retail clinic, urgent care center, or emergency room.  If you have a medical emergency, please immediately call 911 or go to the emergency department.  Pager Numbers  - Dr. Gwen Pounds: [phone]  - Dr. Roseanne Reno: [phone]  - Dr. Katrinka Blazing: [phone]   In the event of inclement weather, please call our main line at [phone] for an update on the status of any delays or closures.  Dermatology Medication Tips: Please keep the boxes that topical medications come in in order to help keep track of the instructions about where and how to use these. Pharmacies typically print the medication instructions only on the boxes and not directly on the medication tubes.   If your medication is too expensive, please contact our office at [phone] option 4 or send [place] a message through MyChart.   We are unable to tell what your co-pay for medications will be in advance as this is different depending on your insurance coverage. However, we may be able to find a substitute medication at lower cost or fill out paperwork to get insurance to cover a needed medication.   If a prior authorization is  required to get your medication covered by your insurance company, please allow [place] 1-2 business days to complete this process.  Drug prices often vary depending on where the prescription is filled and some pharmacies may offer cheaper prices.  The website www.goodrx.com contains coupons for medications through different pharmacies. The prices here do not account for what the cost may be with help from insurance (it may be cheaper with your insurance), but the website can give you the price if you did not use any insurance.  - You can print the associated coupon and take it with your prescription to the pharmacy.  - You may also stop by our office during regular business hours and pick up a GoodRx coupon card.  - If you need your prescription sent electronically to a different pharmacy, notify our office through [facility] or by phone at [phone] option 4.     Si Usted Necesita Algo Despus de Su Visita  Tambin puede enviarnos un mensaje a travs de Clinical cytogeneticist. Por lo general respondemos a los mensajes de MyChart en el transcurso de 1 a 2 das hbiles.  Para renovar recetas, por favor pida a su farmacia que se ponga en contacto con nuestra oficina. Annie Sable de fax es [place] [phone].  Si tiene un asunto urgente cuando la clnica est cerrada y que no puede esperar hasta el siguiente da hbil, puede llamar/localizar a su doctor(a) al nmero que aparece a continuacin.   Por favor, tenga en cuenta que aunque hacemos todo lo posible para estar disponibles para asuntos urgentes fuera del horario de [place], no estamos disponibles las 24 horas del da, los 7 [address]  [address] de la [place].   Si tiene un problema urgente y no puede comunicarse con nosotros, puede optar por buscar atencin mdica  en el consultorio de su doctor(a), en una clnica privada, en un centro de atencin urgente o en una sala de emergencias.  Si tiene Engineer, drilling, por favor llame inmediatamente al 911 o vaya a  la sala de emergencias.  Nmeros de bper  - Dr. Gwen Pounds:  [phone]  - Dra. Roseanne Reno: [phone]  - Dr. Katrinka Blazing: [phone]   En caso de inclemencias del tiempo, por favor llame a Lacy Duverney principal al [phone] para una actualizacin sobre el [place] de cualquier retraso o cierre.  Consejos para la medicacin en dermatologa: Por favor, guarde las cajas en las que vienen los medicamentos de uso tpico para ayudarle a seguir las instrucciones sobre dnde y cmo usarlos. Las farmacias generalmente imprimen las instrucciones del medicamento slo en las cajas y no directamente en los tubos del [place].   Si su medicamento es muy caro, por favor, pngase en contacto con Rolm Gala llamando al [phone] y presione la opcin 4 o envenos un mensaje a travs de Clinical cytogeneticist.   No podemos decirle cul ser su copago por los medicamentos por adelantado ya que esto es diferente dependiendo de la cobertura de su seguro. Sin embargo, es posible que podamos encontrar un medicamento sustituto a Audiological scientist un formulario para que el seguro cubra el medicamento que se considera necesario.   Si se requiere una autorizacin previa para que su compaa de seguros [place] su medicamento, por favor permtanos de 1 a 2 das hbiles para completar [address]  Los precios de los medicamentos varan con frecuencia dependiendo del Environmental consultant de dnde se surte la receta y alguna farmacias pueden ofrecer precios ms baratos.  El sitio web www.goodrx.com tiene cupones para medicamentos de Health and safety inspector. Los precios aqu no tienen en cuenta lo que podra costar con la ayuda del seguro (puede ser ms barato con su seguro), pero el sitio web puede darle el precio si no utiliz Tourist information centre manager.  - Puede imprimir el cupn correspondiente y llevarlo con su receta a la farmacia.  - Tambin puede pasar por nuestra oficina durante el horario de atencin regular y Education officer, museum una tarjeta de cupones de  GoodRx.  - Si necesita que su receta se enve electrnicamente a una farmacia diferente, informe a nuestra oficina a travs de MyChart de [place] o por telfono llamando al [phone] y presione la opcin 4.

## 2023-06-13 LAB — SURGICAL PATHOLOGY

## 2023-06-14 ENCOUNTER — Telehealth: Payer: Self-pay

## 2023-06-14 DIAGNOSIS — C4441 Basal cell carcinoma of skin of scalp and neck: Secondary | ICD-10-CM

## 2023-06-14 DIAGNOSIS — C4442 Squamous cell carcinoma of skin of scalp and neck: Secondary | ICD-10-CM

## 2023-06-14 NOTE — Telephone Encounter (Signed)
Discussed pathology results and treatment plan. Patient voiced understanding. He will treat sites on thigh with 5FU/Calcipotriene once healed. Will recheck at follow up visit. Mohs referral sent to Dr. Caralyn Guile for Mohs. BCC on left neck, SCC on right posterior neck.

## 2023-06-14 NOTE — Telephone Encounter (Signed)
-----   Message from [facility] sent at 06/13/2023 10:49 PM EST ----- Diagnosis: 1. Skin, right thigh - posterior :       SQUAMOUS CELL CARCINOMA IN SITU, HYPERTROPHIC, BASE INVOLVED        2. Skin, right thigh - anterior :       SQUAMOUS CELL CARCINOMA IN SITU        3. Skin, left paraspinal posterior neck :       BASAL CELL CARCINOMA, SUPERFICIAL AND NODULAR PATTERNS        4. Skin, right posterior neck :       SQUAMOUS CELL CARCINOMA, KERATOACANTHOMA TYPE    Please call with diagnosis and determine where the patient would like to have Mohs surgery.  TWO FOR 5FU Calcipotriene (patient already has cream) 1. R thigh post 2. R thigh ant  Explanation: the two biopsies from your right thigh show squamous cell skin cancers limited to the top layer of skin.   Treatment: The fluorouracil/calcipotriene cream can help your immune system clear the rest of the skin cancers. Please allow these areas to heal (up to 4 weeks) and then apply the cream twice per day until you get a reaction, then stop. We will recheck them at your next appointment.    TWO FOR MOHS 3. LEFT posterior neck Explanation: your biopsy shows a basal cell skin cancer in the second layer of the skin. This is the most common kind of skin cancer and is caused by damage from sun exposure. Basal cell skin cancers almost never spread beyond the skin, so they are not dangerous to your overall health. However, they will continue to grow, can bleed, cause nonhealing wounds, and disrupt nearby structures unless fully treated.  4. RIGHT posterior neck Explanation: This is a squamous cell skin cancer that has grown beyond the surface of the skin and is invading the second layer of the skin. It has the potential to spread beyond the skin and threaten your health, so I recommend treating it.  Treatment: Given the location and type of skin cancer, I recommend Mohs surgery. Mohs surgery involves cutting out the skin cancer and then  checking under the microscope to ensure the whole skin cancer was removed. If any skin cancer remains, the surgeon will cut out more until it is fully removed. The cure rate is about 98-99%. Once the Mohs surgeon confirms the skin cancer is out, they will discuss the options to repair or heal the area. You must take it easy for about two weeks after surgery (no lifting over 10-15 lbs, avoid activity to get your heart rate and blood pressure up). It is done at another office outside of [place] ([place], [place], or [place]).

## 2023-06-16 ENCOUNTER — Ambulatory Visit: Payer: Managed Care, Other (non HMO) | Admitting: Dermatology

## 2023-06-28 ENCOUNTER — Encounter: Payer: Self-pay | Admitting: Dermatology

## 2023-06-29 ENCOUNTER — Encounter: Payer: Self-pay | Admitting: Dermatology

## 2023-06-29 ENCOUNTER — Ambulatory Visit: Payer: Managed Care, Other (non HMO) | Admitting: Dermatology

## 2023-06-29 VITALS — BP 136/88 | HR 68 | Temp 98.8°F

## 2023-06-29 DIAGNOSIS — C4442 Squamous cell carcinoma of skin of scalp and neck: Secondary | ICD-10-CM | POA: Diagnosis not present

## 2023-06-29 DIAGNOSIS — L814 Other melanin hyperpigmentation: Secondary | ICD-10-CM | POA: Diagnosis not present

## 2023-06-29 DIAGNOSIS — C4492 Squamous cell carcinoma of skin, unspecified: Secondary | ICD-10-CM

## 2023-06-29 DIAGNOSIS — L579 Skin changes due to chronic exposure to nonionizing radiation, unspecified: Secondary | ICD-10-CM | POA: Diagnosis not present

## 2023-06-29 NOTE — Patient Instructions (Signed)

## 2023-06-29 NOTE — Progress Notes (Signed)
Follow-Up Visit   Subjective  Calvin Peters is a 53 y.o. 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 Katrinka Blazing.   The following portions of the chart were reviewed this encounter and updated as appropriate: medications, allergies, medical history  Review of Systems:  No other skin or systemic complaints except as noted in HPI or Assessment and Plan.  Objective  Well appearing patient in no apparent distress; mood and affect are within normal limits.  A focused examination was performed of the following areas: Right posterior neck Relevant physical exam findings are noted in the Assessment and Plan.     Assessment & Plan   SQUAMOUS CELL CARCINOMA OF SKIN right posterior neck Mohs surgery   Anesthesia: Anesthesia method: local infiltration Local anesthetic: lidocaine 1% WITH epi  Procedure Details: Timeout: pre-procedure verification complete Procedure Prep: patient was prepped and draped in usual sterile fashion Prep type: chlorhexidine Specimen debulked: Yes   Pre-Op diagnosis: squamous cell carcinoma SCC subtype: well differentiated and KA type MohsAIQ Surgical site (if tumor spans multiple areas, please select predominant area): neck Surgical site (from skin exam): right posterior neck Pre-operative length (cm): 1.1 Pre-operative width (cm): 1 Indications for Mohs surgery: anatomic location where tissue conservation is critical  Micrographic Surgery Details: Post-operative length (cm): 1.6 Post-operative width (cm): 1.5 Number of Mohs stages: 1  Skin repair Complexity:  Complex Final length (cm):  5.8 Informed consent: discussed and consent obtained   Timeout: patient name, date of birth, surgical site, and procedure verified   Procedure prep:  Patient was prepped and draped in usual sterile fashion Prep type:  Chlorhexidine Anesthesia: the lesion was anesthetized in a standard  fashion   Anesthetic:  1% lidocaine w/ epinephrine 1-100,000 buffered w/ 8.4% NaHCO3 Reason for type of repair: reduce tension to allow closure, allow closure of the large defect, preserve normal anatomy, avoid adjacent structures and allow side-to-side closure without requiring a flap or graft   Undermining: area extensively undermined   Subcutaneous layers (deep stitches):  Suture size:  4-0 Suture type: Vicryl (polyglactin 910)   Stitches:  Buried vertical mattress Fine/surface layer approximation (top stitches):  Suture size:  5-0 Suture type: fast-absorbing plain gut   Stitches: simple running   Hemostasis achieved with: suture, pressure and electrodesiccation Outcome: patient tolerated procedure well with no complications   Post-procedure details: sterile dressing applied and wound care instructions given   Dressing type: pressure dressing and bandage    No follow-ups on file.  Owens Shark, CMA, am acting as scribe for Gwenith Daily, MD.    06/29/2023  HISTORY OF PRESENT ILLNESS  Calvin Peters is seen in consultation at the request of Dr. Katrinka Blazing for biopsy-proven Squamous Cell Carcinoma-KA type on the posterior neck. They note that the area has been present for about 6 months-1 year increasing in size with time and tenderness.  There is no history of previous treatment.  Reports no other new or changing lesions and has no other complaints today.  Medications and allergies: see patient chart.  Review of systems: Reviewed 8 systems and notable for the above skin cancer.  All other systems reviewed are unremarkable/negative, unless noted in the HPI. Past medical history, surgical history, family history, social history were also reviewed and are noted in the chart/questionnaire.    PHYSICAL EXAMINATION  General: Well-appearing, in no acute distress, alert and oriented x 4. Vitals reviewed in chart (if  available).   Skin: Exam reveals a 1.1 x 1.0 cm erythematous papule and  biopsy scar on the posterior neck. There are rhytids, telangiectasias, and lentigines, consistent with photodamage.   Biopsy report(s) reviewed, confirming the diagnosis.   ASSESSMENT  1) Squamous Cell Carcinoma- KA type- read as Well Differentiated Squamous Cell Carcinoma on vertical sections 2) photodamage 3) solar lentigines   PLAN   1. Due to location, size, histology, or recurrence and the likelihood of subclinical extension as well as the need to conserve normal surrounding tissue, the patient was deemed acceptable for Mohs micrographic surgery (MMS).  The nature and purpose of the procedure, associated benefits and risks including recurrence and scarring, possible complications such as pain, infection, and bleeding, and alternative methods of treatment if appropriate were discussed with the patient during consent. The lesion location was verified by the patient, by reviewing previous notes, pathology reports, and by photographs as well as angulation measurements if available.  Informed consent was reviewed and signed by the patient, and timeout was performed at 9:30. See op note below.  2. For the photodamage and solar lentigines, sun protection discussed/information given on OTC sunscreens, and we recommend continued regular follow-up with primary dermatologist every 6 months or sooner for any growing, bleeding, or changing lesions. 3. Prognosis and future surveillance discussed. 4. Letter with treatment outcome sent to referring provider. 5. Pain acetaminophen/ibuprofen   MOHS MICROGRAPHIC SURGERY AND RECONSTRUCTION  Initial size:   1.1 x 1.0 cm Surgical defect/wound size: 1.5 x 1.6 cm Anesthesia:    0.33% lidocaine with 1:200,000 epinephrine EBL:    <5 mL Complications:  None Repair type:   Complex SQ suture:   4-0 Vicryl Cutaneous suture:  5-0 Fast Absorbing Gut Final size of the repair: 5.8 cm  Vertical Frozen Sections from Debulking The patient underwent tumor debulking  surgery for a mass located at posterior neck, which had clinical features suspicious for an ungraded malignancy. During the procedure, frozen vertical sections were processed and analyzed intraoperatively to verify the original diagnosis. The histological examination of the frozen sections revealed changes consistent with Well-Differentiated Squamous Cell Carcinoma. Microscopic features included keratin pearls, intercellular bridges, and marked cellular pleomorphism with a relatively low mitotic index. The tumor cells exhibited prominent eosinophilic cytoplasm and atypical nuclei with irregular contours. There was also evidence of limited stromal invasion, with minimal to no perineural or vascular involvement. The final diagnosis of Well-Differentiated Squamous Cell Carcinoma was confirmed, and further management will be tailored based on this diagnosis, including potential additional resection, radiation, or surveillance.  Stages: 1  STAGE I: Anesthesia achieved with 0.5% lidocaine with 1:200,000 epinephrine. ChloraPrep applied. 1 section(s) excised using Mohs technique (this includes total peripheral and deep tissue margin excision and evaluation with frozen sections, excised and interpreted by the same physician). The tumor was first debulked and then excised with an approx. 2mm margin.  Hemostasis was achieved with electrocautery as needed.  The specimen was then oriented, subdivided/relaxed, inked, and processed using Mohs technique.    Frozen section analysis revealed a clear deep and peripheral margin.  Reconstruction  The surgical wound was then cleaned, prepped, and re-anesthetized as above. Wound edges were undermined extensively along at least one entire edge and at a distance equal to or greater than the width of the defect (see wound defect size above) in order to achieve closure and decrease wound tension and anatomic distortion. Redundant tissue repair including standing cone removal was  performed. Hemostasis was achieved with electrocautery. Subcutaneous and  epidermal tissues were approximated with the above sutures. The surgical site was then lightly scrubbed with sterile, saline-soaked gauze. The area was then bandaged using Vaseline ointment, non-adherent gauze, gauze pads, and tape to provide an adequate pressure dressing. The patient tolerated the procedure well, was given detailed written and verbal wound care instructions, and was discharged in good condition.   The patient will follow-up: 4 weeks.   Documentation: I have reviewed the above documentation for accuracy and completeness, and I agree with the above.  Gwenith Daily, MD

## 2023-07-06 ENCOUNTER — Encounter: Payer: Self-pay | Admitting: Dermatology

## 2023-10-13 ENCOUNTER — Encounter: Payer: Self-pay | Admitting: Dermatology

## 2023-12-05 ENCOUNTER — Encounter: Payer: Self-pay | Admitting: Dermatology

## 2023-12-05 ENCOUNTER — Ambulatory Visit: Admitting: Dermatology

## 2023-12-05 DIAGNOSIS — D1801 Hemangioma of skin and subcutaneous tissue: Secondary | ICD-10-CM

## 2023-12-05 DIAGNOSIS — D485 Neoplasm of uncertain behavior of skin: Secondary | ICD-10-CM

## 2023-12-05 DIAGNOSIS — L570 Actinic keratosis: Secondary | ICD-10-CM

## 2023-12-05 DIAGNOSIS — Q828 Other specified congenital malformations of skin: Secondary | ICD-10-CM

## 2023-12-05 DIAGNOSIS — Z1283 Encounter for screening for malignant neoplasm of skin: Secondary | ICD-10-CM | POA: Diagnosis not present

## 2023-12-05 DIAGNOSIS — L821 Other seborrheic keratosis: Secondary | ICD-10-CM

## 2023-12-05 DIAGNOSIS — Z85828 Personal history of other malignant neoplasm of skin: Secondary | ICD-10-CM

## 2023-12-05 DIAGNOSIS — L578 Other skin changes due to chronic exposure to nonionizing radiation: Secondary | ICD-10-CM

## 2023-12-05 DIAGNOSIS — L608 Other nail disorders: Secondary | ICD-10-CM

## 2023-12-05 DIAGNOSIS — L814 Other melanin hyperpigmentation: Secondary | ICD-10-CM | POA: Diagnosis not present

## 2023-12-05 DIAGNOSIS — D229 Melanocytic nevi, unspecified: Secondary | ICD-10-CM

## 2023-12-05 DIAGNOSIS — L729 Follicular cyst of the skin and subcutaneous tissue, unspecified: Secondary | ICD-10-CM

## 2023-12-05 DIAGNOSIS — D492 Neoplasm of unspecified behavior of bone, soft tissue, and skin: Secondary | ICD-10-CM

## 2023-12-05 DIAGNOSIS — W908XXA Exposure to other nonionizing radiation, initial encounter: Secondary | ICD-10-CM

## 2023-12-05 NOTE — Progress Notes (Signed)
 Follow-Up Visit   Subjective  Calvin Peters is a 53 y.o. male who presents for the following: Skin Cancer Screening and Full Body Skin Exam  The patient presents for Total-Body Skin Exam (TBSE) for skin cancer screening and mole check. The patient has spots, moles and lesions to be evaluated, some may be new or changing and the patient may have concern these could be cancer.  Hx BCC, SCC. Patient noticed a dark spot at base of left big toe about a month ago. Cyst at left scalp getting larger.   The following portions of the chart were reviewed this encounter and updated as appropriate: medications, allergies, medical history  Review of Systems:  No other skin or systemic complaints except as noted in HPI or Assessment and Plan.  Objective  Well appearing patient in no apparent distress; mood and affect are within normal limits.  A full examination was performed including scalp, head, eyes, ears, nose, lips, neck, chest, axillae, abdomen, back, buttocks, bilateral upper extremities, bilateral lower extremities, hands, feet, fingers, toes, fingernails, and toenails. All findings within normal limits unless otherwise noted below.   Relevant physical exam findings are noted in the Assessment and Plan.  head, neck, arms Erythematous thin papules/macules with gritty scale.   Assessment & Plan   SKIN CANCER SCREENING PERFORMED TODAY.  ACTINIC DAMAGE - Chronic condition, secondary to cumulative UV/sun exposure - diffuse scaly erythematous macules with underlying dyspigmentation - Recommend daily broad spectrum sunscreen SPF 30+ to sun-exposed areas, reapply every 2 hours as needed.  - Staying in the shade or wearing long sleeves, sun glasses (UVA+UVB protection) and wide brim hats (4-inch brim around the entire circumference of the hat) are also recommended for sun protection.  - Call for new or changing lesions.  LENTIGINES, SEBORRHEIC KERATOSES, HEMANGIOMAS - Benign normal skin  lesions - Benign-appearing - Call for any changes  MELANOCYTIC NEVI - Tan-brown and/or pink-flesh-colored symmetric macules and papules - Benign appearing on exam today - Observation - Call clinic for new or changing moles - Recommend daily use of broad spectrum spf 30+ sunscreen to sun-exposed areas.   HISTORY OF SQUAMOUS CELL CARCINOMA OF THE SKIN - No evidence of recurrence today - No lymphadenopathy - Recommend regular full body skin exams - Recommend daily broad spectrum sunscreen SPF 30+ to sun-exposed areas, reapply every 2 hours as needed.  - Call if any new or changing lesions are noted between office visits   HISTORY OF BASAL CELL CARCINOMA OF THE SKIN - No evidence of recurrence today - Recommend regular full body skin exams - Recommend daily broad spectrum sunscreen SPF 30+ to sun-exposed areas, reapply every 2 hours as needed.  - Call if any new or changing lesions are noted between office visits - Referral was sent for Calvin Peters at left paraspinal posterior neck and SCC at right posterior neck but Dr. Corey only did SCC.  - BCC is clear on exam today, will monitor for recurrence per patient preference.   Toe lesion Exam: Red to yellow patch obscuring lunula of L first toenail   Treatment Plan: Will monitor for change. Discussed biopsy    Scalp Cyst Exam: Subcutaneous nodule at left post parietal scalp  Benign-appearing. 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 Discussed that a cyst is a benign growth that can grow over time and sometimes get irritated or inflamed. Recommend observation if it is not bothersome. Discussed option of surgical excision to remove it if it is growing, symptomatic, or other changes  noted. Please call for new or changing lesions so they can be evaluated.   AK (ACTINIC KERATOSIS) head, neck, arms Actinic keratoses are precancerous spots that appear secondary to cumulative UV radiation exposure/sun exposure over time. They are chronic with  expected duration over 1 year. A portion of actinic keratoses will progress to squamous cell carcinoma of the skin. It is not possible to reliably predict which spots will progress to skin cancer and so treatment is recommended to prevent development of skin cancer.  Recommend daily broad spectrum sunscreen SPF 30+ to sun-exposed areas, reapply every 2 hours as needed.  Recommend staying in the shade or wearing long sleeves, sun glasses (UVA+UVB protection) and wide brim hats (4-inch brim around the entire circumference of the hat). Call for new or changing lesions.  Patient going out of town this weekend. Will plan to treat with LN2 at surgery appt for pilar cyst.  POROKERATOSIS Exam: right anterior thigh  Treatment Plan: Benign-appearing.  Observation.  Call clinic for new or changing lesions.  Recommend daily use of broad spectrum spf 30+ sunscreen to sun-exposed areas.    NEOPLASM OF UNCERTAIN BEHAVIOR OF SKIN Left Shoulder - Anterior, left posterior shoulder ISK vs SCC Will plan biopsy on follow up if indicated.  MULTIPLE BENIGN NEVI   LENTIGINES   ACTINIC ELASTOSIS   SEBORRHEIC KERATOSES   CHERRY ANGIOMA   FOLLICULAR CYST OF SKIN AND SUBCUTANEOUS TISSUE   POROKERATOSIS   Return in about 2 months (around 02/05/2024) for recheck left great toenail , with Dr. Claudene, surgery for pilar cyst, biopsies, AK's.  Calvin Peters, RMA, am acting as scribe for Boneta Claudene, MD .   Documentation: I have reviewed the above documentation for accuracy and completeness, and I agree with the above.  Boneta Claudene, MD

## 2024-01-05 ENCOUNTER — Ambulatory Visit: Payer: Managed Care, Other (non HMO) | Admitting: Dermatology

## 2024-02-08 ENCOUNTER — Encounter: Payer: Self-pay | Admitting: Dermatology

## 2024-02-08 ENCOUNTER — Ambulatory Visit: Admitting: Dermatology

## 2024-02-08 DIAGNOSIS — L7211 Pilar cyst: Secondary | ICD-10-CM | POA: Diagnosis not present

## 2024-02-08 DIAGNOSIS — L820 Inflamed seborrheic keratosis: Secondary | ICD-10-CM | POA: Diagnosis not present

## 2024-02-08 MED ORDER — MUPIROCIN 2 % EX OINT: 1.0000 | TOPICAL_OINTMENT | Freq: Every day | CUTANEOUS | 0 refills | Status: AC

## 2024-02-08 NOTE — Progress Notes (Signed)
 Follow-Up Visit   Subjective  Calvin Peters is a 53 y.o. male who presents for the following: Excision of pilar cyst at left post parietal scalp. AK's to treat at head, neck and arms.   The following portions of the chart were reviewed this encounter and updated as appropriate: medications, allergies, medical history  Review of Systems:  No other skin or systemic complaints except as noted in HPI or Assessment and Plan.  Objective  Well appearing patient in no apparent distress; mood and affect are within normal limits.  A focused examination was performed of the following areas: Scalp, head, neck and arms Relevant physical exam findings are noted in the Assessment and Plan.   left post parietal scalp Subcutaneous nodule at scalp 2.1 cm Right Upper Arm - Anterior x 1, L forearm extensor x 1, posterior vertex scalp x 1, L upper arm x 2, L post shoulder x 1 (6) Hyperkeratotic pink papules and plaques  Assessment & Plan   PILAR CYST left post parietal scalp Skin excision - left post parietal scalp  Excision method:  elliptical Lesion length (cm):  2.1 Total excision diameter (cm):  2.1 Informed consent: discussed and consent obtained   Timeout: patient name, date of birth, surgical site, and procedure verified   Procedure prep:  Patient was prepped and draped in usual sterile fashion Prep type:  Chlorhexidine Anesthesia: the lesion was anesthetized in a standard fashion   Anesthetic:  1% lidocaine w/ epinephrine 1-100,000 buffered w/ 8.4% NaHCO3 (9 cc) Instrument used: #15 blade   Hemostasis achieved with: suture, pressure and electrodesiccation   Outcome: patient tolerated procedure well with no complications    Skin repair - left post parietal scalp Complexity:  Intermediate Final length (cm):  2.7 Informed consent: discussed and consent obtained   Timeout: patient name, date of birth, surgical site, and procedure verified   Procedure prep:  Patient was prepped and  draped in usual sterile fashion Prep type:  Chlorhexidine Anesthesia: the lesion was anesthetized in a standard fashion   Anesthetic:  1% lidocaine w/ epinephrine 1-100,000 buffered w/ 8.4% NaHCO3 Reason for type of repair: reduce tension to allow closure, reduce the risk of dehiscence, infection, and necrosis, reduce subcutaneous dead space and avoid a hematoma, allow closure of the large defect and preserve normal anatomy   Undermining: edges could be approximated without difficulty   Subcutaneous layers (deep stitches):  Suture size:  4-0 Suture type: Monocryl (poliglecaprone 25)   Stitches:  Buried vertical mattress Fine/surface layer approximation (top stitches):  Suture size:  5-0 Suture type: Prolene (polypropylene)   Stitches: simple running   Suture removal (days):  7 Hemostasis achieved with: suture, pressure and electrodesiccation Outcome: patient tolerated procedure well with no complications   Post-procedure details: sterile dressing applied and wound care instructions given   Dressing type: petrolatum, bandage and pressure dressing    Specimen 1 - Surgical pathology Differential Diagnosis: Pilar Cyst  Check Margins: No INFLAMED SEBORRHEIC KERATOSIS (6) Right Upper Arm - Anterior x 1, L forearm extensor x 1, posterior vertex scalp x 1, L upper arm x 2, L post shoulder x 1 (6) Symptomatic, irritating, patient would like treated. Offered biopsy of lesions. Patient prefers cryo and recheck Destruction of lesion - Right Upper Arm - Anterior x 1, L forearm extensor x 1, posterior vertex scalp x 1, L upper arm x 2, L post shoulder x 1 (6) Complexity: simple   Destruction method: cryotherapy   Informed consent: discussed and consent  obtained   Timeout:  patient name, date of birth, surgical site, and procedure verified Lesion destroyed using liquid nitrogen: Yes   Region frozen until ice ball extended beyond lesion: Yes   Cryotherapy cycles:  1 (1 or 2) Outcome: patient  tolerated procedure well with no complications   Post-procedure details: wound care instructions given      Return in about 1 week (around 02/15/2024) for Suture Removal, with Dr. Claudene.  [name], RMA, am acting as scribe for Boneta Claudene, MD .   Documentation: I have reviewed the above documentation for accuracy and completeness, and I agree with the above.  Boneta Claudene, MD

## 2024-02-08 NOTE — Patient Instructions (Signed)

## 2024-02-09 ENCOUNTER — Telehealth: Payer: Self-pay

## 2024-02-09 ENCOUNTER — Ambulatory Visit: Payer: Self-pay | Admitting: Dermatology

## 2024-02-09 LAB — SURGICAL PATHOLOGY

## 2024-02-09 NOTE — Telephone Encounter (Signed)
 Called patient to see how he is doing after yesterday's surgery. N/A, LVM to call if any concerns. Lonell RAMAN., RRMA

## 2024-02-13 NOTE — Telephone Encounter (Signed)
 Patient advised pathology showed benign pilar cyst, doing well. Lonell RAMAN., RMA

## 2024-02-13 NOTE — Telephone Encounter (Signed)
-----   Message from [place] sent at 02/09/2024  5:51 PM EDT ----- Diagnosis left post parietal scalp :       EXCISION, PILAR CYST   Please call to share that excision removed benign cyst and get update on surgical wound. Thank you. ----- Message ----- From: Interface, [facility] Sent: 02/09/2024   4:04 PM EDT To: Boneta Sharps, MD

## 2024-02-16 ENCOUNTER — Encounter: Payer: Self-pay | Admitting: Dermatology

## 2024-02-16 ENCOUNTER — Ambulatory Visit: Admitting: Dermatology

## 2024-02-16 DIAGNOSIS — C4442 Squamous cell carcinoma of skin of scalp and neck: Secondary | ICD-10-CM

## 2024-02-16 DIAGNOSIS — Z4802 Encounter for removal of sutures: Secondary | ICD-10-CM

## 2024-02-16 DIAGNOSIS — D492 Neoplasm of unspecified behavior of bone, soft tissue, and skin: Secondary | ICD-10-CM

## 2024-02-16 DIAGNOSIS — Z5189 Encounter for other specified aftercare: Secondary | ICD-10-CM

## 2024-02-16 NOTE — Progress Notes (Signed)
   Follow-Up Visit   Subjective  Calvin Peters is a 53 y.o. male who presents for the following: suture removal, 1 wk f/u pilar cyst (bx proven) excision of L post parietal scalp   The following portions of the chart were reviewed this encounter and updated as appropriate: medications, allergies, medical history  Review of Systems:  No other skin or systemic complaints except as noted in HPI or Assessment and Plan.  Objective  Well appearing patient in no apparent distress; mood and affect are within normal limits.   A focused examination was performed of the following areas: Scalp, left arm  Relevant exam findings are noted in the Assessment and Plan.  Posterior parietal scalp 1.2cm ulcerative pink plaque   Assessment & Plan   PILAR CYST Bx proven L post parietal scalp Exam: healing excision site  Treatment Plan: Encounter for Removal of Sutures - Incision site at the L post parietal scalp is clean, dry and intact - Wound cleansed, sutures removed, wound cleansed and steri strips applied.  - Discussed pathology results showing Pilar cyst  - Patient advised to keep steri-strips dry until they fall off. - Scars remodel for a full year. - Once steri-strips fall off, patient can apply over-the-counter silicone scar cream each night to help with scar remodeling if desired. - Patient advised to call with any concerns or if they notice any new or changing lesions.   HISTORY OF SQUAMOUS CELL CARCINOMA IN SITU OF THE SKIN - No evidence of recurrence today - Recommend regular full body skin exams - Recommend daily broad spectrum sunscreen SPF 30+ to sun-exposed areas, reapply every 2 hours as needed.  - Call if any new or changing lesions are noted between office visits  - R thigh anterior, R thigh post both clear with 5FU/Calcipotriene cream  INFLAMED SEBORRHEIC KERATOSIS S/p 1 wk LN@ L proximal extensor forear Exam: scaly keratotic pap  Treatment Plan: Recheck on  f/u  NEOPLASM OF SKIN Posterior parietal scalp Epidermal / dermal shaving  Lesion diameter (cm):  1.2 Informed consent: discussed and consent obtained   Timeout: patient name, date of birth, surgical site, and procedure verified   Procedure prep:  Patient was prepped and draped in usual sterile fashion Prep type:  Isopropyl alcohol Anesthesia: the lesion was anesthetized in a standard fashion   Anesthetic:  1% lidocaine w/ epinephrine 1-100,000 buffered w/ 8.4% NaHCO3 Instrument used: DermaBlade   Hemostasis achieved with: pressure and aluminum chloride   Outcome: patient tolerated procedure well   Post-procedure details: wound care instructions given    Specimen 1 - Surgical pathology Differential Diagnosis: SCC vs SK  Check Margins: yes 1.2cm ulcerative pink plaque VISIT FOR WOUND CHECK   ENCOUNTER FOR REMOVAL OF SUTURES    Return in about 4 months (around 06/18/2024) for TBSE, Hx of BCC, Hx of SCC, Hx of SCC IS, Hx of AKs.  I, Grayce Saunas, RMA, am acting as scribe for Boneta Sharps, MD .   Documentation: I have reviewed the above documentation for accuracy and completeness, and I agree with the above.  Boneta Sharps, MD

## 2024-02-16 NOTE — Patient Instructions (Addendum)

## 2024-02-20 ENCOUNTER — Ambulatory Visit: Payer: Self-pay | Admitting: Dermatology

## 2024-02-20 DIAGNOSIS — C4492 Squamous cell carcinoma of skin, unspecified: Secondary | ICD-10-CM

## 2024-02-20 LAB — SURGICAL PATHOLOGY

## 2024-02-20 NOTE — Telephone Encounter (Signed)
-----   Message from [place] sent at 02/20/2024  4:21 PM EDT ----- Diagnosis: posterior parietal scalp :       WELL DIFFERENTIATED SQUAMOUS CELL CARCINOMA, DEEP MARGIN INVOLVED   Please call with diagnosis and refer to Mohs surgery.  Explanation: This is a squamous cell skin cancer that has grown beyond the surface of the skin and is invading the second layer of the skin. It has the potential to spread beyond the skin and  threaten your health, so I recommend treating it.  Treatment: Mohs ----- Message ----- From: Interface, Lab In [facility] Sent: 02/20/2024   9:40 AM EDT To: Boneta Sharps, MD

## 2024-02-22 NOTE — Telephone Encounter (Signed)
 Patient advised of BX results and referred to Dr. Paci for [facility]. aw

## 2024-02-22 NOTE — Addendum Note (Signed)
 Addended by: TERESA PALMA R on: 02/22/2024 11:39 AM   Modules accepted: Orders

## 2024-04-04 ENCOUNTER — Encounter: Payer: Self-pay | Admitting: Dermatology

## 2024-04-04 ENCOUNTER — Ambulatory Visit: Admitting: Dermatology

## 2024-04-04 VITALS — BP 134/80 | HR 77 | Temp 98.8°F

## 2024-04-04 DIAGNOSIS — L814 Other melanin hyperpigmentation: Secondary | ICD-10-CM

## 2024-04-04 DIAGNOSIS — C4442 Squamous cell carcinoma of skin of scalp and neck: Secondary | ICD-10-CM | POA: Diagnosis not present

## 2024-04-04 DIAGNOSIS — C4492 Squamous cell carcinoma of skin, unspecified: Secondary | ICD-10-CM

## 2024-04-04 DIAGNOSIS — L578 Other skin changes due to chronic exposure to nonionizing radiation: Secondary | ICD-10-CM | POA: Diagnosis not present

## 2024-04-04 NOTE — Patient Instructions (Signed)

## 2024-04-04 NOTE — Progress Notes (Signed)
 Follow-Up Visit   Subjective  Calvin Peters is a 53 y.o. 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 Claudene.   The following portions of the chart were reviewed this encounter and updated as appropriate: medications, allergies, medical history  Review of Systems:  No other skin or systemic complaints except as noted in HPI or Assessment and Plan.  Objective  Well appearing patient in no apparent distress; mood and affect are within normal limits.  A focused examination was performed of the following areas: Posterior parietal scalp Relevant physical exam findings are noted in the Assessment and Plan.   posterior Parietal Scalp Pink hyperkeratotic papule   Assessment & Plan   SQUAMOUS CELL CARCINOMA OF SKIN posterior Parietal Scalp Mohs surgery  Consent obtained: written  Anticoagulation: Was the anticoagulation regimen changed prior to Mohs? No    Anesthesia: Anesthesia method: local infiltration Local anesthetic: lidocaine 1% WITH epi  Procedure Details: Timeout: pre-procedure verification complete Procedure Prep: patient was prepped and draped in usual sterile fashion Prep type: chlorhexidine Biopsy accession number: [id number] Pre-Op diagnosis: squamous cell carcinoma SCC subtype: well differentiated MohsAIQ Surgical site (if tumor spans multiple areas, please select predominant area): scalp Surgery side: midline Surgical site (from skin exam): posterior Parietal Scalp Pre-operative length (cm): 1.8 Pre-operative width (cm): 1 Indications for Mohs surgery: anatomic location where tissue conservation is critical  Micrographic Surgery Details: Post-operative length (cm): 2.1 Post-operative width (cm): 1.5 Number of Mohs stages: 1  Stage 1    Tumor features identified on Mohs section: no tumor identified  Reconstruction: Was the defect reconstructed? Yes    Was reconstruction performed by the same Mohs surgeon? Yes   Setting of reconstruction: outpatient office When was reconstruction performed? same day Type of reconstruction: linear Linear reconstruction: complex  Skin repair Complexity:  Complex Final length (cm):  4.8 Informed consent: discussed and consent obtained   Timeout: patient name, date of birth, surgical site, and procedure verified   Procedure prep:  Patient was prepped and draped in usual sterile fashion Prep type:  Chlorhexidine Anesthesia: the lesion was anesthetized in a standard fashion   Anesthetic:  1% lidocaine w/ epinephrine 1-100,000 buffered w/ 8.4% NaHCO3 Reason for type of repair: reduce tension to allow closure, preserve normal anatomical and functional relationships, avoid adjacent structures and allow side-to-side closure without requiring a flap or graft   Undermining: area extensively undermined   Subcutaneous layers (deep stitches):  Suture size:  3-0 (and staples) Suture type: Vicryl (polyglactin 910)   Stitches:  Buried vertical mattress Fine/surface layer approximation (top stitches):  Suture type comment:  Staples Hemostasis achieved with: suture, pressure and electrodesiccation Outcome: patient tolerated procedure well with no complications   Post-procedure details: sterile dressing applied and wound care instructions given   Dressing type: bandage and pressure dressing      Return for around dec 4 wound check/ staple removal.    I, Darice Smock, CMA, am acting as scribe for RUFUS CHRISTELLA HOLY, MD.    04/04/2024  HISTORY OF PRESENT ILLNESS  Calvin Peters is seen in consultation at the request of Dr. 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 They note that the area has been present for about 4 months increasing in size with time.  There is no history of previous treatment.  Reports no other new or changing lesions and has no other  complaints today.  Medications  and allergies: see patient chart.  Review of systems: Reviewed 8 systems and notable for the above skin cancer.  All other systems reviewed are unremarkable/negative, unless noted in the HPI. Past medical history, surgical history, family history, social history were also reviewed and are noted in the chart/questionnaire.    PHYSICAL EXAMINATION  General: Well-appearing, in no acute distress, alert and oriented x 4. Vitals reviewed in chart (if available).   Skin: Exam reveals a 1.8 x 1.0 cm erythematous papule and biopsy scar on the posterior parietal scalp. There are rhytids, telangiectasias, and lentigines, consistent with photodamage.  Biopsy report(s) reviewed, confirming the diagnosis.   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 Due to location, size, histology, or recurrence and the likelihood of subclinical extension as well as the need to conserve normal surrounding tissue, the patient was deemed acceptable for Mohs micrographic surgery (MMS).  The nature and purpose of the procedure, associated benefits and risks including recurrence and scarring, possible complications such as pain, infection, and bleeding, and alternative methods of treatment if appropriate were discussed with the patient during consent. The lesion location was verified by the patient, by reviewing previous notes, pathology reports, and by photographs as well as angulation measurements if available.  Informed consent was reviewed and signed by the patient, and timeout was performed at 9:30 AM. See op note below.  2. For the photodamage and solar lentigines, sun protection discussed/information given on OTC sunscreens, and we recommend continued regular follow-up with primary dermatologist every 6 months or sooner for any growing, bleeding, or changing lesions. 3. Prognosis and future  surveillance discussed. 4. Letter with treatment outcome sent to referring provider. 5. Pain acetaminophen/ibuprofen  MOHS MICROGRAPHIC SURGERY AND RECONSTRUCTION  Initial size:   1.8 x 1.0 cm Surgical defect/wound size: 2.1 x 1.5 cm Anesthesia:    0.33% lidocaine with 1:200,000 epinephrine EBL:    <5 mL Complications:  None Repair type:   Complex SQ suture:   3-0 Vicryl Cutaneous suture:  Staples Final size of the repair: 4.8 cm  Stages: 1  STAGE I: Anesthesia achieved with 0.5% lidocaine with 1:200,000 epinephrine. ChloraPrep applied. 1 section(s) excised using Mohs technique (this includes total peripheral and deep tissue margin excision and evaluation with frozen sections, excised and interpreted by the same physician). The tumor was first debulked and then excised with an approx. 2mm margin.  Hemostasis was achieved with electrocautery as needed.  The specimen was then oriented, subdivided/relaxed, inked, and processed using Mohs technique.    Frozen section analysis revealed a clear deep and peripheral margin.  Reconstruction  The surgical wound was then cleaned, prepped, and re-anesthetized as above. Wound edges were undermined extensively along at least one entire edge and at a distance equal to or greater than the width of the defect (see wound defect size above) in order to achieve closure and decrease wound tension and anatomic distortion. Redundant tissue repair including standing cone removal was performed. Hemostasis was achieved with electrocautery. Subcutaneous and epidermal tissues were approximated with the above sutures. The surgical site was then lightly scrubbed with sterile, saline-soaked gauze. The area was then bandaged using Vaseline ointment, non-adherent gauze, gauze pads, and tape to provide an adequate pressure dressing. The patient tolerated the procedure well, was given detailed written and verbal wound care instructions, and was discharged in good condition.    The patient will follow-up: 4 weeks.  Documentation: I have reviewed the above documentation for accuracy and completeness, and I agree with the above.  RUFUS CHRISTELLA HOLY, MD

## 2024-04-18 ENCOUNTER — Ambulatory Visit: Admitting: Dermatology

## 2024-04-18 ENCOUNTER — Encounter: Payer: Self-pay | Admitting: Dermatology

## 2024-04-18 VITALS — BP 122/91 | HR 65 | Temp 98.1°F

## 2024-04-18 DIAGNOSIS — Z85828 Personal history of other malignant neoplasm of skin: Secondary | ICD-10-CM

## 2024-04-18 DIAGNOSIS — Z48817 Encounter for surgical aftercare following surgery on the skin and subcutaneous tissue: Secondary | ICD-10-CM

## 2024-04-18 DIAGNOSIS — C4492 Squamous cell carcinoma of skin, unspecified: Secondary | ICD-10-CM

## 2024-04-18 DIAGNOSIS — L905 Scar conditions and fibrosis of skin: Secondary | ICD-10-CM | POA: Diagnosis not present

## 2024-04-18 NOTE — Patient Instructions (Signed)

## 2024-04-18 NOTE — Progress Notes (Signed)
   Follow Up Visit   Subjective  Calvin Peters is a 53 y.o. 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 The patient has been bandaging the wound as directed. The endorse the following concerns: none  The following portions of the chart were reviewed this encounter and updated as appropriate: medications, allergies, medical history  Review of Systems:  No other skin or systemic complaints except as noted in HPI or Assessment and Plan.  Objective  Well appearing patient in no apparent distress; mood and affect are within normal limits.  A focal examination was performed including scalp, head, All findings within normal limits unless otherwise noted below.  Healing wound with mild erythema  Relevant physical exam findings are noted in the Assessment and Plan.       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 , treated on 04/04/24, repaired with a linear closure - Reassured that wound is healing well - Staples removed - No evidence of infection - No swelling, induration, purulence, dehiscence, or tenderness out of proportion to the clinical exam, see photo above - Discussed that scars take up to 12 months to mature from the date of surgery - Recommend SPF 30+ to scar daily to prevent purple color from UV exposure during scar maturation process - Discussed that erythema and raised appearance of scar will fade over the next 4-6 months - OK to start scar massage at 4-6 weeks post-op - Can consider silicone based products for scar healing starting at 6 weeks post-op  HISTORY OF SQUAMOUS CELL CARCINOMA OF THE SKIN - No evidence of recurrence today - Recommend regular full body skin exams -  Recommend daily broad spectrum sunscreen SPF 30+ to sun-exposed areas, reapply every 2 hours as needed.  - Call if any new or changing lesions are noted between office visits     Return if symptoms worsen or fail to improve.  I, Doyce Pan, CMA, am acting as scribe for RUFUS CHRISTELLA HOLY, MD.   Documentation: I have reviewed the above documentation for accuracy and completeness, and I agree with the above.  RUFUS CHRISTELLA HOLY, MD

## 2024-04-25 ENCOUNTER — Encounter: Payer: Self-pay | Admitting: Dermatology

## 2024-05-28 ENCOUNTER — Ambulatory Visit: Admitting: Dermatology

## 2024-05-28 ENCOUNTER — Encounter: Payer: Self-pay | Admitting: Dermatology

## 2024-05-28 DIAGNOSIS — B351 Tinea unguium: Secondary | ICD-10-CM | POA: Diagnosis not present

## 2024-05-28 MED ORDER — TERBINAFINE HCL 250 MG PO TABS: 250.0000 mg | ORAL_TABLET | Freq: Every day | ORAL | 2 refills | Status: AC

## 2024-05-28 NOTE — Patient Instructions (Signed)
 Terbinafine Counseling  Terbinafine is an anti-fungal medicine that can be applied to the skin (over the counter) or taken by mouth (prescription) to treat fungal infections. The pill version is often used to treat fungal infections of the nails or scalp. While most people do not have any side effects from taking terbinafine pills, some possible side effects of the medicine can include taste changes, headache, loss of smell, vision changes, nausea, vomiting, or diarrhea.   Rare side effects can include irritation of the liver, allergic reaction, or decrease in blood counts (which may show up as not feeling well or developing an infection). If you are concerned about any of these side effects, please stop the medicine and call your doctor, or in the case of an emergency such as feeling very unwell, seek immediate medical care.    Due to recent changes in healthcare laws, you may see results of your pathology and/or laboratory studies on MyChart before the doctors have had a chance to review them. We understand that in some cases there may be results that are confusing or concerning to you. Please understand that not all results are received at the same time and often the doctors may need to interpret multiple results in order to provide you with the best plan of care or course of treatment. Therefore, we ask that you please give us  2 business days to thoroughly review all your results before contacting the office for clarification. Should we see a critical lab result, you will be contacted sooner.   If You Need Anything After Your Visit  If you have any questions or concerns for your doctor, please call our main line at [phone] and press option 4 to reach your doctor's medical assistant. If no one answers, please leave a voicemail as directed and we will return your call as soon as possible. Messages left after 4 pm will be answered the following business day.   You may also send us  a message via  MyChart. We typically respond to MyChart messages within 1-2 business days.  For prescription refills, please ask your pharmacy to contact our office. Our fax number is [phone].  If you have an urgent issue when the clinic is closed that cannot wait until the next business day, you can page your doctor at the number below.    Please note that while we do our best to be available for urgent issues outside of office hours, we are not available 24/7.   If you have an urgent issue and are unable to reach us , you may choose to seek medical care at your doctor's office, retail clinic, urgent care center, or emergency room.  If you have a medical emergency, please immediately call 911 or go to the emergency department.  Pager Numbers  - Dr. Hester: [phone]  - Dr. Jackquline: [phone]  - Dr. Claudene: [phone]   - Dr. Raymund: [phone]  In the event of inclement weather, please call our main line at [phone] for an update on the status of any delays or closures.  Dermatology Medication Tips: Please keep the boxes that topical medications come in in order to help keep track of the instructions about where and how to use these. Pharmacies typically print the medication instructions only on the boxes and not directly on the medication tubes.   If your medication is too expensive, please contact our office at [phone] option 4 or send us  a message through MyChart.   We are unable to tell what  your co-pay for medications will be in advance as this is different depending on your insurance coverage. However, we may be able to find a substitute medication at lower cost or fill out paperwork to get insurance to cover a needed medication.   If a prior authorization is required to get your medication covered by your insurance company, please allow us  1-2 business days to complete this process.  Drug prices often vary depending on where the prescription is filled and some  pharmacies may offer cheaper prices.  The website www.goodrx.com contains coupons for medications through different pharmacies. The prices here do not account for what the cost may be with help from insurance (it may be cheaper with your insurance), but the website can give you the price if you did not use any insurance.  - You can print the associated coupon and take it with your prescription to the pharmacy.  - You may also stop by our office during regular business hours and pick up a GoodRx coupon card.  - If you need your prescription sent electronically to a different pharmacy, notify our office through [facility] or by phone at [phone] option 4.     Si Usted Necesita Algo Despus de Su Visita  Tambin puede enviarnos un mensaje a travs de Clinical cytogeneticist. Por lo general respondemos a los mensajes de MyChart en el transcurso de 1 a 2 das hbiles.  Para renovar recetas, por favor pida a su farmacia que se ponga en contacto con nuestra oficina. Randi lakes de fax es [place] [phone].  Si tiene un asunto urgente cuando la clnica est cerrada y que no puede esperar hasta el siguiente da hbil, puede llamar/localizar a su doctor(a) al nmero que aparece a continuacin.   Por favor, tenga en cuenta que aunque hacemos todo lo posible para estar disponibles para asuntos urgentes fuera del horario de [place], no estamos disponibles las 24 horas del da, los 7 [address]  [address] de la [place].   Si tiene un problema urgente y no puede comunicarse con nosotros, puede optar por buscar atencin mdica  en el consultorio de su doctor(a), en una clnica privada, en un centro de atencin urgente o en una sala de emergencias.  Si tiene Engineer, drilling, por favor llame inmediatamente al 911 o vaya a la sala de emergencias.  Nmeros de bper  - Dr. Hester: [phone]  - Dra. Jackquline: [phone]  - Dr. Claudene: [phone]  - Dra. Kitts: [phone]  En caso de inclemencias del [place],  por favor llame a nuestra lnea principal al [phone] para una actualizacin sobre el estado de cualquier retraso o cierre.  Consejos para la medicacin en dermatologa: Por favor, guarde las cajas en las que vienen los medicamentos de uso tpico para ayudarle a seguir las instrucciones sobre dnde y cmo usarlos. Las farmacias generalmente imprimen las instrucciones del medicamento slo en las cajas y no directamente en los tubos del [place].   Si su medicamento es muy caro, por favor, pngase en contacto con landry rieger llamando al [phone] y presione la opcin 4 o envenos un mensaje a travs de Clinical cytogeneticist.   No podemos decirle cul ser su copago por los medicamentos por adelantado ya que esto es diferente dependiendo de la cobertura de su seguro. Sin embargo, es posible que podamos encontrar un medicamento sustituto a Audiological scientist un formulario para que el seguro cubra el medicamento que se considera necesario.   Si se requiere una autorizacin previa para que su compaa  de seguros malta su medicamento, por favor permtanos de 1 a 2 das hbiles para completar [address]  Los precios de los medicamentos varan con frecuencia dependiendo del Environmental consultant de dnde se surte la receta y alguna farmacias pueden ofrecer precios ms baratos.  El sitio web www.goodrx.com tiene cupones para medicamentos de Health and safety inspector. Los precios aqu no tienen en cuenta lo que podra costar con la ayuda del seguro (puede ser ms barato con su seguro), pero el sitio web puede darle el precio si no utiliz Tourist information centre manager.  - Puede imprimir el cupn correspondiente y llevarlo con su receta a la farmacia.  - Tambin puede pasar por nuestra oficina durante el horario de atencin regular y Education officer, museum una tarjeta de cupones de GoodRx.  - Si necesita que su receta se enve electrnicamente a una farmacia diferente, informe a nuestra oficina a travs de MyChart de [place] o por telfono llamando al  [phone] y presione la opcin 4.

## 2024-05-28 NOTE — Progress Notes (Signed)
" ° °  Follow-Up Visit   Subjective  Calvin Peters is a 54 y.o. male who presents for the following: toe lesion. Patient mentioned it at last TBSE 11/2023 and at the time he thought it could have been trauma. Since then it has gotten much worse, painful.    The following portions of the chart were reviewed this encounter and updated as appropriate: medications, allergies, medical history  Review of Systems:  No other skin or systemic complaints except as noted in HPI or Assessment and Plan.  Objective  Well appearing patient in no apparent distress; mood and affect are within normal limits.    A focused examination was performed of the following areas: Left foot and great toe  Relevant exam findings are noted in the Assessment and Plan.    Assessment & Plan   ONYCHOMYCOSIS Exam: Thickened toenails with subungal debris c/w onychomycosis at left first toenail and right lateral first toenail  No evidence of skin cancer on exam today.  Chronic and persistent condition with duration or expected duration over one year. Condition is symptomatic/ bothersome to patient. Not currently at goal.  Treatment Plan: No liver problems. Patient only has a few drinks on weekends. He is on generic Humira for Crohn's.   Start terbinafine  (Lamisil ) 250 mg once a day for12 week course Discussed other treatment options 12 weeks for toenails, 6 weeks for fingernail, 3 weeks for tinea corporis Confirmed CrCl>50 and no known liver disease Educated patient on common side effects like headache, GI upset, rash Less common side effects include taste/smell disturbance, liver damage, neutropenia, thrombocytopenia Lab monitoring not needed if no pre-existing abnormalities and <57 years old without liver/heme comorbidities Contact if worsening pain bleeding mass displacement of nail plate 87-81 months for nail to grow out ONYCHOMYCOSIS   This Visit - terbinafine  (LAMISIL ) 250 MG tablet - Take 1 tablet (250  mg total) by mouth daily. Take 250 mg daily for 3 months  Return for TBSE, as scheduled, with Dr. Claudene, [facility], [facility], [facility].  Calvin Peters, RMA, am acting as scribe for Boneta Claudene, MD .    Documentation: I have reviewed the above documentation for accuracy and completeness, and I agree with the above.  Boneta Claudene, MD    "

## 2024-05-29 ENCOUNTER — Telehealth: Payer: Self-pay

## 2024-05-29 DIAGNOSIS — B351 Tinea unguium: Secondary | ICD-10-CM

## 2024-05-29 NOTE — Telephone Encounter (Addendum)
 Patient called and states he thought about what was mentioned at last appointment about starting terbinafine  pills vs cream. He would like to start cream treatment instead of terbinafine  pills. He request cream be sent to [facility] on [address]   Please advise

## 2024-05-30 MED ORDER — TERBINAFINE HCL 1 % EX CREA: 1.0000 | TOPICAL_CREAM | Freq: Two times a day (BID) | CUTANEOUS | 11 refills | Status: AC

## 2024-06-19 ENCOUNTER — Ambulatory Visit: Admitting: Dermatology

## 2024-07-03 ENCOUNTER — Ambulatory Visit: Admitting: Dermatology
